# Patient Record
Sex: Female | Born: 1975 | Hispanic: Yes | Marital: Married | Smoking: Never smoker
Health system: Southern US, Community
[De-identification: ages and names within clinical notes are randomized; demographics above are authoritative.]

## PROBLEM LIST (undated history)

## (undated) DIAGNOSIS — B019 Varicella without complication: Secondary | ICD-10-CM

## (undated) DIAGNOSIS — F419 Anxiety disorder, unspecified: Secondary | ICD-10-CM

## (undated) DIAGNOSIS — F329 Major depressive disorder, single episode, unspecified: Secondary | ICD-10-CM

## (undated) DIAGNOSIS — Z789 Other specified health status: Secondary | ICD-10-CM

## (undated) DIAGNOSIS — F32A Depression, unspecified: Secondary | ICD-10-CM

## (undated) DIAGNOSIS — N809 Endometriosis, unspecified: Secondary | ICD-10-CM

## (undated) DIAGNOSIS — N979 Female infertility, unspecified: Secondary | ICD-10-CM

## (undated) DIAGNOSIS — T7431XA Adult psychological abuse, confirmed, initial encounter: Secondary | ICD-10-CM

## (undated) DIAGNOSIS — Z8719 Personal history of other diseases of the digestive system: Secondary | ICD-10-CM

## (undated) DIAGNOSIS — T7840XA Allergy, unspecified, initial encounter: Secondary | ICD-10-CM

## (undated) HISTORY — DX: Depression, unspecified: F32.A

## (undated) HISTORY — DX: Allergy, unspecified, initial encounter: T78.40XA

## (undated) HISTORY — DX: Personal history of other diseases of the digestive system: Z87.19

## (undated) HISTORY — PX: PELVIC LAPAROSCOPY: SHX162

## (undated) HISTORY — DX: Major depressive disorder, single episode, unspecified: F32.9

## (undated) HISTORY — DX: Female infertility, unspecified: N97.9

## (undated) HISTORY — PX: LAPAROSCOPIC ABDOMINAL EXPLORATION: SHX6249

## (undated) HISTORY — DX: Adult psychological abuse, confirmed, initial encounter: T74.31XA

## (undated) HISTORY — DX: Varicella without complication: B01.9

## (undated) HISTORY — DX: Anxiety disorder, unspecified: F41.9

---

## 2015-03-19 ENCOUNTER — Encounter (HOSPITAL_COMMUNITY): Payer: Self-pay | Admitting: *Deleted

## 2015-03-19 ENCOUNTER — Inpatient Hospital Stay (HOSPITAL_COMMUNITY)
Admission: AD | Admit: 2015-03-19 | Discharge: 2015-03-19 | Disposition: A | Discharge status: Home / Self Care | Payer: Self-pay | Source: Ambulatory Visit | Attending: Obstetrics & Gynecology | Admitting: Obstetrics & Gynecology

## 2015-03-19 ENCOUNTER — Inpatient Hospital Stay (HOSPITAL_COMMUNITY): Payer: Self-pay

## 2015-03-19 DIAGNOSIS — O360110 Maternal care for anti-D [Rh] antibodies, first trimester, not applicable or unspecified: Secondary | ICD-10-CM

## 2015-03-19 DIAGNOSIS — O209 Hemorrhage in early pregnancy, unspecified: Secondary | ICD-10-CM | POA: Insufficient documentation

## 2015-03-19 DIAGNOSIS — Z3A08 8 weeks gestation of pregnancy: Secondary | ICD-10-CM | POA: Insufficient documentation

## 2015-03-19 DIAGNOSIS — Z6791 Unspecified blood type, Rh negative: Secondary | ICD-10-CM | POA: Insufficient documentation

## 2015-03-19 DIAGNOSIS — O26841 Uterine size-date discrepancy, first trimester: Secondary | ICD-10-CM | POA: Insufficient documentation

## 2015-03-19 DIAGNOSIS — O26893 Other specified pregnancy related conditions, third trimester: Secondary | ICD-10-CM | POA: Insufficient documentation

## 2015-03-19 HISTORY — DX: Endometriosis, unspecified: N80.9

## 2015-03-19 LAB — URINALYSIS, ROUTINE W REFLEX MICROSCOPIC
BILIRUBIN URINE: NEGATIVE
GLUCOSE, UA: NEGATIVE mg/dL
Ketones, ur: NEGATIVE mg/dL
Leukocytes, UA: NEGATIVE
Nitrite: NEGATIVE
Protein, ur: NEGATIVE mg/dL
Specific Gravity, Urine: 1.01 (ref 1.005–1.030)
UROBILINOGEN UA: 0.2 mg/dL (ref 0.0–1.0)
pH: 6 (ref 5.0–8.0)

## 2015-03-19 LAB — URINE MICROSCOPIC-ADD ON

## 2015-03-19 LAB — CBC
HEMATOCRIT: 38.2 % (ref 36.0–46.0)
Hemoglobin: 12.9 g/dL (ref 12.0–15.0)
MCH: 29.4 pg (ref 26.0–34.0)
MCHC: 33.8 g/dL (ref 30.0–36.0)
MCV: 87 fL (ref 78.0–100.0)
Platelets: 286 10*3/uL (ref 150–400)
RBC: 4.39 MIL/uL (ref 3.87–5.11)
RDW: 12.6 % (ref 11.5–15.5)
WBC: 8.7 10*3/uL (ref 4.0–10.5)

## 2015-03-19 LAB — WET PREP, GENITAL
Clue Cells Wet Prep HPF POC: NONE SEEN
Trich, Wet Prep: NONE SEEN
Yeast Wet Prep HPF POC: NONE SEEN

## 2015-03-19 LAB — POCT PREGNANCY, URINE: Preg Test, Ur: POSITIVE — AB

## 2015-03-19 LAB — ABO/RH: ABO/RH(D): A NEG

## 2015-03-19 LAB — HCG, QUANTITATIVE, PREGNANCY: hCG, Beta Chain, Quant, S: 1221 m[IU]/mL — ABNORMAL HIGH (ref <5)

## 2015-03-19 MED ORDER — RHO D IMMUNE GLOBULIN 1500 UNIT/2ML IJ SOSY
300.0000 ug | PREFILLED_SYRINGE | Freq: Once | INTRAMUSCULAR | Status: AC
  Administered 2015-03-19: 300 ug via INTRAMUSCULAR
  Filled 2015-03-19: qty 2

## 2015-03-19 MED ORDER — CONCEPT OB 130-92.4-1 MG PO CAPS: 1.0000 | ORAL_CAPSULE | Freq: Every day | ORAL | Status: DC

## 2015-03-19 NOTE — Discharge Instructions (Signed)
Vaginal Bleeding During Pregnancy, First Trimester °A small amount of bleeding (spotting) from the vagina is relatively common in early pregnancy. It usually stops on its own. Various things may cause bleeding or spotting in early pregnancy. Some bleeding may be related to the pregnancy, and some may not. In most cases, the bleeding is normal and is not a problem. However, bleeding can also be a sign of something serious. Be sure to tell your health care provider about any vaginal bleeding right away. °Some possible causes of vaginal bleeding during the first trimester include: °· Infection or inflammation of the cervix. °· Growths (polyps) on the cervix. °· Miscarriage or threatened miscarriage. °· Pregnancy tissue has developed outside of the uterus and in a fallopian tube (tubal pregnancy). °· Tiny cysts have developed in the uterus instead of pregnancy tissue (molar pregnancy). °HOME CARE INSTRUCTIONS  °Watch your condition for any changes. The following actions may help to lessen any discomfort you are feeling: °· Follow your health care provider's instructions for limiting your activity. If your health care provider orders bed rest, you may need to stay in bed and only get up to use the bathroom. However, your health care provider may allow you to continue light activity. °· If needed, make plans for someone to help with your regular activities and responsibilities while you are on bed rest. °· Keep track of the number of pads you use each day, how often you change pads, and how soaked (saturated) they are. Write this down. °· Do not use tampons. Do not douche. °· Do not have sexual intercourse or orgasms until approved by your health care provider. °· If you pass any tissue from your vagina, save the tissue so you can show it to your health care provider. °· Only take over-the-counter or prescription medicines as directed by your health care provider. °· Do not take aspirin because it can make you  bleed. °· Keep all follow-up appointments as directed by your health care provider. °SEEK MEDICAL CARE IF: °· You have any vaginal bleeding during any part of your pregnancy. °· You have cramps or labor pains. °· You have a fever, not controlled by medicine. °SEEK IMMEDIATE MEDICAL CARE IF:  °· You have severe cramps in your back or belly (abdomen). °· You pass large clots or tissue from your vagina. °· Your bleeding increases. °· You feel light-headed or weak, or you have fainting episodes. °· You have chills. °· You are leaking fluid or have a gush of fluid from your vagina. °· You pass out while having a bowel movement. °MAKE SURE YOU: °· Understand these instructions. °· Will watch your condition. °· Will get help right away if you are not doing well or get worse. °Document Released: 09/12/2005 Document Revised: 12/08/2013 Document Reviewed: 08/10/2013 °ExitCare® Patient Information ©2015 [employer]. This information is not intended to replace advice given to you by your health care provider. Make sure you discuss any questions you have with your health care provider. ° °Rh Incompatibility °Rh incompatibility is a condition that occurs during pregnancy if a woman has Rh-negative blood and her baby has Rh-positive blood. "Rh-negative" and "Rh-positive" refer to whether or not the blood has an Rh factor. An Rh factor is a specific protein found on the surface of red blood cells. If a woman has Rh factor, she is Rh-positive. If she does not have an Rh factor, she is Rh-negative. Having or not having an Rh factor does not affect the mother's general   health. However, it can cause problems during pregnancy.  WHAT KIND OF PROBLEMS CAN Rh INCOMPATIBILITY CAUSE? During pregnancy, blood from the baby can cross into the mother's bloodstream, especially during delivery. If a mother is Rh-negative and the baby is Rh-positive, the mother's defense system will react to the baby's blood as if it was a foreign substance and  will create proteins (antibodies). This is called sensitization. Once the mother is sensitized, her Rh antibodies will cross the placenta to the baby and attack the baby's Rh-positive blood as if it is a harmful substance.  Rh incompatibility can also happen if the Rh-negative pregnant woman is exposed to the Rh factor during a blood transfusion with Rh-positive blood.  HOW DOES THIS CONDITION AFFECT MY BABY? The Rh antibodies that attack and destroy the baby's red blood cells can lead to hemolytic disease in the baby. Hemolytic disease is when the red blood cells break down. This can cause:   Yellowing of the skin and eyes (jaundice).  The body to not have enough healthy red blood cells (anemia).   Brain damage.   Heart failure.   Death.  These antibodies usually do not cause problems during a first pregnancy. This is because the blood from the baby often times crosses into the mother's bloodstream during delivery, and the baby is born before many of the antibodies can develop. However, the antibodies stay in your body once they have formed. Because of this, Rh incompatibility is more likely to cause problems in second or later pregnancies (if the baby is Rh-positive).  HOW IS THIS CONDITION DIAGNOSED? When a woman becomes pregnant, blood tests may be done to find out her blood type and Rh factor. If the woman is Rh-negative, she also may have another blood test called an antibody screen. The antibody screen shows whether she has Rh antibodies in her blood. If she does, it means she was exposed to Rh-positive blood before, and she is at risk for Rh incompatibility.  To find out whether the baby is developing hemolytic anemia and how serious it is, caregivers may use more advanced tests, such as ultrasonography (commonly known as ultrasound).  HOW IS Rh INCOMPATIBILITY TREATED?  Rh incompatibility is treated with a shot of medicine called Rho (D) immune globulin. This medicine keeps the  woman's body from making antibodies that can cause serious problems in the baby or future babies.  Two shots will be given, one at around your seventh month of pregnancy and the other within 72 hours of your baby being born. If you are Rh-negative, you will need this medicine every time you have a baby with Rh-positive blood. If you already have antibodies in your blood, Rho (D) immune globulin will not help. Your doctor will not give you this medicine, but will watch your pregnancy closely for problems instead.  This shot may also be given to an Rh-negative woman when the risk of blood transfer between the mom and baby is high. The risk is high with:   An amniocentesis.   A miscarriage or an abortion.   An ectopic pregnancy.   Any vaginal bleeding during pregnancy.  Document Released: 05/25/2002 Document Revised: 12/08/2013 Document Reviewed: 03/17/2013 [facility] Patient Information 2015 [place], [place]. This information is not intended to replace advice given to you by your health care provider. Make sure you discuss any questions you have with your health care provider.  Pelvic Rest Pelvic rest is sometimes recommended for women when:   The placenta is partially  or completely covering the opening of the cervix (placenta previa).  There is bleeding between the uterine wall and the amniotic sac in the first trimester (subchorionic hemorrhage).  The cervix begins to open without labor starting (incompetent cervix, cervical insufficiency).  The labor is too early (preterm labor). HOME CARE INSTRUCTIONS  Do not have sexual intercourse, stimulation, or an orgasm.  Do not use tampons, douche, or put anything in the vagina.  Do not lift anything over 10 pounds (4.5 kg).  Avoid strenuous activity or straining your pelvic muscles. SEEK MEDICAL CARE IF:  You have any vaginal bleeding during pregnancy. Treat this as a potential emergency.  You have cramping pain felt low in the  stomach (stronger than menstrual cramps).  You notice vaginal discharge (watery, mucus, or bloody).  You have a low, dull backache.  There are regular contractions or uterine tightening. SEEK IMMEDIATE MEDICAL CARE IF: You have vaginal bleeding and have placenta previa.  Document Released: 03/30/2011 Document Revised: 02/25/2012 Document Reviewed: 03/30/2011 [facility] Patient Information 2015 [place], [place]. This information is not intended to replace advice given to you by your health care provider. Make sure you discuss any questions you have with your health care provider.

## 2015-03-19 NOTE — MAU Note (Signed)
Pink vag d/c this am. About 1830 went to [facility] and saw bright blood on tissue with ? Piece of tissue. Mild  abd discomfort

## 2015-03-19 NOTE — MAU Note (Addendum)
Pt reports pinkish discharge this morning. Then pt noticed some blood and some small clots about 2 hours ago. Upon using MAU bathroom, pt reports "not much bleeding" [facility]

## 2015-03-19 NOTE — MAU Provider Note (Signed)
Chief Complaint: Vaginal Bleeding   First Provider Initiated Contact with Patient 03/19/15 2012     SUBJECTIVE HPI: April Andersen is a 39 y.o. G1P0 at [redacted]w[redacted]d by LMP who presents to [facility] reporting pink bleeding since this morning that increased later in the day and decreased by this evening. Passed small clots. Denies passage of tissue.  NOB visit scheduled 4/13 at [facility] No other testing this pregnancy. Last period was shorter than usual, but normal time. Regular monthly cycles.   Past Medical History  Diagnosis Date  . Endometriosis    OB History  Gravida Para Term Preterm AB SAB TAB Ectopic Multiple Living  1             # Outcome Date GA Lbr Len/2nd Weight Sex Delivery Anes PTL Lv  1 Current              Past Surgical History  Procedure Laterality Date  . Laparoscopic abdominal exploration     History   Social History  . Marital Status: Married    Spouse Name: N/A  . Number of Children: N/A  . Years of Education: N/A   Occupational History  . Not on file.   Social History Main Topics  . Smoking status: Never Smoker   . Smokeless tobacco: Not on file  . Alcohol Use: No  . Drug Use: No  . Sexual Activity: Yes   Other Topics Concern  . Not on file   Social History Narrative   No current facility-administered medications on file prior to encounter.   No current outpatient prescriptions on file prior to encounter.   No Known Allergies  Review of Systems  Constitutional: Negative for fever and chills.  Gastrointestinal: Negative for nausea, vomiting, abdominal pain, diarrhea and constipation.  Genitourinary: Negative for dysuria, urgency, frequency, hematuria and flank pain.  Neurological: Negative for dizziness.   OBJECTIVE Blood pressure 122/66, pulse 87, temperature 98.7 F (37.1 C), resp. rate 20, height  (1.727 m), weight 153 lb (69.4 kg), last menstrual period 01/21/2015. GENERAL: Well-developed, well-nourished female in  no acute distress.  HEART: normal rate RESP: normal effort GI: Abdomen soft, non-tender. Positive bowel sounds 4. MS: Nontender, no edema NEURO: Alert and oriented GU:   SPECULUM EXAM: NEFG, small amount of dark red blood in vault. Scant active bleeding, cervix clean  BIMANUAL: cervix closed; uterus ? slightly enlarged, no adnexal tenderness or masses  No CVA tenderness.  LAB RESULTS Results for orders placed or performed during the hospital encounter of 03/19/15 (from the past 24 hour(s))  Urinalysis, Routine w reflex microscopic     Status: Abnormal   Collection Time: 03/19/15  7:06 PM  Result Value Ref Range   Color, Urine YELLOW YELLOW   APPearance CLEAR CLEAR   Specific Gravity, Urine 1.010 1.005 - 1.030   pH 6.0 5.0 - 8.0   Glucose, UA NEGATIVE NEGATIVE mg/dL   Hgb urine dipstick MODERATE (A) NEGATIVE   Bilirubin Urine NEGATIVE NEGATIVE   Ketones, ur NEGATIVE NEGATIVE mg/dL   Protein, ur NEGATIVE NEGATIVE mg/dL   Urobilinogen, UA 0.2 0.0 - 1.0 mg/dL   Nitrite NEGATIVE NEGATIVE   Leukocytes, UA NEGATIVE NEGATIVE  Urine microscopic-add on     Status: Abnormal   Collection Time: 03/19/15  7:06 PM  Result Value Ref Range   Squamous Epithelial / LPF RARE RARE   WBC, UA 0-2 <3 WBC/hpf   RBC / HPF 7-10 <3 RBC/hpf   Bacteria, UA FEW (A)  RARE  Pregnancy, urine POC     Status: Abnormal   Collection Time: 03/19/15  7:23 PM  Result Value Ref Range   Preg Test, Ur POSITIVE (A) NEGATIVE  ABO/Rh     Status: None   Collection Time: 03/19/15  8:15 PM  Result Value Ref Range   ABO/RH(D) A NEG   hCG, quantitative, pregnancy     Status: Abnormal   Collection Time: 03/19/15  8:15 PM  Result Value Ref Range   hCG, Beta Chain, Quant, S 1221 (H) <5 mIU/mL  CBC     Status: None   Collection Time: 03/19/15  8:15 PM  Result Value Ref Range   WBC 8.7 4.0 - 10.5 K/uL   RBC 4.39 3.87 - 5.11 MIL/uL   Hemoglobin 12.9 12.0 - 15.0 g/dL   HCT 40.938.2 81.136.0 - 91.446.0 %   MCV 87.0 78.0 - 100.0 fL    MCH 29.4 26.0 - 34.0 pg   MCHC 33.8 30.0 - 36.0 g/dL   RDW 78.212.6 95.611.5 - 21.315.5 %   Platelets 286 150 - 400 K/uL  Rh IG workup (includes ABO/Rh)     Status: None (Preliminary result)   Collection Time: 03/19/15  8:15 PM  Result Value Ref Range   Gestational Age(Wks) 4.6    ABO/RH(D) A NEG    Antibody Screen NEG    Unit Number [id number]    Blood Component Type RHIG    Unit division 00    Status of Unit ISSUED    Transfusion Status OK TO TRANSFUSE   Wet prep, genital     Status: Abnormal   Collection Time: 03/19/15  9:40 PM  Result Value Ref Range   Yeast Wet Prep HPF POC NONE SEEN NONE SEEN   Trich, Wet Prep NONE SEEN NONE SEEN   Clue Cells Wet Prep HPF POC NONE SEEN NONE SEEN   WBC, Wet Prep HPF POC FEW (A) NONE SEEN    IMAGING [place] Ob Comp Less 14 Wks  03/19/2015   CLINICAL DATA:  Vaginal bleeding, positive pregnancy test  EXAM: OBSTETRIC <14 WK US AND TRANSVAGINAL OB US  TECHNIQUE: Both transabdominal and transvaginal ultrasound examinations were performed for complete evaluation of the gestation as well as the maternal uterus, adnexal regions, and pelvic cul-de-sac. Transvaginal technique was performed to assess early pregnancy.  COMPARISON:  None for this pregnancy  FINDINGS: Intrauterine gestational sac: Visualized, mildly irregular in shape and mobile within the endometrial canal at real time imaging according to the sonographer. Small to moderate probable subchorionic hemorrhage.  Yolk sac:  Not visualized  Embryo:  Not visualized  Cardiac Activity: Not visualized  MSD: 4  mm   4 w   6  d               US EDC: 11/20/2015  Maternal uterus/adnexae: Right ovarian corpus luteum. Left ovary is normal. Trace free fluid in the cul-de-sac.  IMPRESSION: Mildly irregular, mobile intrauterine gestational sac but no yolk sac, fetal pole, or cardiac activity yet visualized. Findings are suspicious for, but not definitive of, failed intrauterine pregnancy. Followup ultrasound is recommended in 10-14  days for further assessment and to establish accurate dating.   Electronically Signed   By: Christiana PellantGretchen  Green M.D.   On: 03/19/2015 21:34   [place] Ob Transvaginal  03/19/2015   CLINICAL DATA:  Vaginal bleeding, positive pregnancy test  EXAM: OBSTETRIC <14 WK US AND TRANSVAGINAL OB US  TECHNIQUE: Both transabdominal and transvaginal ultrasound examinations were performed for  complete evaluation of the gestation as well as the maternal uterus, adnexal regions, and pelvic cul-de-sac. Transvaginal technique was performed to assess early pregnancy.  COMPARISON:  None for this pregnancy  FINDINGS: Intrauterine gestational sac: Visualized, mildly irregular in shape and mobile within the endometrial canal at real time imaging according to the sonographer. Small to moderate probable subchorionic hemorrhage.  Yolk sac:  Not visualized  Embryo:  Not visualized  Cardiac Activity: Not visualized  MSD: 4  mm   4 w   6  d               [place] EDC: 11/20/2015  Maternal uterus/adnexae: Right ovarian corpus luteum. Left ovary is normal. Trace free fluid in the cul-de-sac.  IMPRESSION: Mildly irregular, mobile intrauterine gestational sac but no yolk sac, fetal pole, or cardiac activity yet visualized. Findings are suspicious for, but not definitive of, failed intrauterine pregnancy. Followup ultrasound is recommended in 10-14 days for further assessment and to establish accurate dating.   Electronically Signed   By: Christiana Pellant M.D.   On: 03/19/2015 21:34    MAU COURSE Rhophylac given.   ASSESSMENT 1. Rh negative, antepartum, first trimester, not applicable or unspecified fetus   2. Vaginal bleeding in pregnancy, first trimester   3. Uterine size date discrepancy pregnancy, first trimester    PLAN Discharge home in stable condition. SAB and ectopic precautions. Bleeding precautions. Pelvic rest 1 week. GC/Chlamydia cultures pending     Follow-up Information    Follow up with THE [facility]  MATERNITY ADMISSIONS In 2 days.   Why:  for repeat pregnancy hormone level or sooner as needed if symptoms worsen.    Contact information:   [address] [id number] mc [place] [address] [phone]       Medication List    TAKE these medications        CONCEPT OB 130-92.4-1 MG Caps  Take 1 tablet by mouth daily.       [place], CNM 03/19/2015  11:40 PM

## 2015-03-20 LAB — RH IG WORKUP (INCLUDES ABO/RH)
ABO/RH(D): A NEG
Antibody Screen: NEGATIVE
Gestational Age(Wks): 4.6
Unit division: 0

## 2015-03-21 ENCOUNTER — Inpatient Hospital Stay (HOSPITAL_COMMUNITY)
Admission: AD | Admit: 2015-03-21 | Discharge: 2015-03-21 | Disposition: A | Discharge status: Home / Self Care | Payer: Self-pay | Source: Ambulatory Visit | Attending: Obstetrics & Gynecology | Admitting: Obstetrics & Gynecology

## 2015-03-21 DIAGNOSIS — O3680X0 Pregnancy with inconclusive fetal viability, not applicable or unspecified: Secondary | ICD-10-CM

## 2015-03-21 DIAGNOSIS — Z3A08 8 weeks gestation of pregnancy: Secondary | ICD-10-CM | POA: Insufficient documentation

## 2015-03-21 DIAGNOSIS — O0281 Inappropriate change in quantitative human chorionic gonadotropin (hCG) in early pregnancy: Secondary | ICD-10-CM | POA: Insufficient documentation

## 2015-03-21 DIAGNOSIS — R109 Unspecified abdominal pain: Secondary | ICD-10-CM | POA: Insufficient documentation

## 2015-03-21 DIAGNOSIS — N898 Other specified noninflammatory disorders of vagina: Secondary | ICD-10-CM | POA: Insufficient documentation

## 2015-03-21 DIAGNOSIS — O9989 Other specified diseases and conditions complicating pregnancy, childbirth and the puerperium: Secondary | ICD-10-CM | POA: Insufficient documentation

## 2015-03-21 LAB — GC/CHLAMYDIA PROBE AMP (~~LOC~~) NOT AT ARMC
CHLAMYDIA, DNA PROBE: NEGATIVE
NEISSERIA GONORRHEA: NEGATIVE

## 2015-03-21 LAB — HCG, QUANTITATIVE, PREGNANCY: hCG, Beta Chain, Quant, S: 1541 m[IU]/mL — ABNORMAL HIGH (ref <5)

## 2015-03-21 NOTE — Discharge Instructions (Signed)
°Ectopic Pregnancy °An ectopic pregnancy is when the fertilized egg attaches (implants) outside the uterus. Most ectopic pregnancies occur in the fallopian tube. Rarely do ectopic pregnancies occur on the ovary, intestine, pelvis, or cervix. In an ectopic pregnancy, the fertilized egg does not have the ability to develop into a normal, healthy baby.  °A ruptured ectopic pregnancy is one in which the fallopian tube gets torn or bursts and results in internal bleeding. Often there is intense abdominal pain, and sometimes, vaginal bleeding. Having an ectopic pregnancy can be life threatening. If left untreated, this dangerous condition can lead to a blood transfusion, abdominal surgery, or even death. °CAUSES  °Damage to the fallopian tubes is the suspected cause in most ectopic pregnancies.  °RISK FACTORS °Depending on your circumstances, the risk of having an ectopic pregnancy will vary. The level of risk can be divided into three categories. °High Risk °· You have gone through infertility treatment. °· You have had a previous ectopic pregnancy. °· You have had previous tubal surgery. °· You have had previous surgery to have the fallopian tubes tied (tubal ligation). °· You have tubal problems or diseases. °· You have been exposed to DES. DES is a medicine that was used until 1971 and had effects on babies whose mothers took the medicine. °· You become pregnant while using an intrauterine device (IUD) for birth control.  °Moderate Risk °· You have a history of infertility. °· You have a history of a sexually transmitted infection (STI). °· You have a history of pelvic inflammatory disease (PID). °· You have scarring from endometriosis. °· You have multiple sexual partners. °· You smoke.  °Low Risk °· You have had previous pelvic surgery. °· You use vaginal douching. °· You became sexually active before 37 years of age. °SIGNS AND SYMPTOMS  °An ectopic pregnancy should be suspected in anyone who has missed a period  and has abdominal pain or bleeding. °· You may experience normal pregnancy symptoms, such as: °¨ Nausea. °¨ Tiredness. °¨ Breast tenderness. °· Other symptoms may include: °¨ Pain with intercourse. °¨ Irregular vaginal bleeding or spotting. °¨ Cramping or pain on one side or in the lower abdomen. °¨ Fast heartbeat. °¨ Passing out while having a bowel movement. °· Symptoms of a ruptured ectopic pregnancy and internal bleeding may include: °¨ Sudden, severe pain in the abdomen and pelvis. °¨ Dizziness or fainting. °¨ Pain in the shoulder area. °DIAGNOSIS  °Tests that may be performed include: °· A pregnancy test. °· An ultrasound test. °· Testing the specific level of pregnancy hormone in the bloodstream. °· Taking a sample of uterus tissue (dilation and curettage, D&C). °· Surgery to perform a visual exam of the inside of the abdomen using a thin, lighted tube with a tiny camera on the end (laparoscope). °TREATMENT  °An injection of a medicine called methotrexate may be given. This medicine causes the pregnancy tissue to be absorbed. It is given if: °· The diagnosis is made early. °· The fallopian tube has not ruptured. °· You are considered to be a good candidate for the medicine. °Usually, pregnancy hormone blood levels are checked after methotrexate treatment. This is to be sure the medicine is effective. It may take 4-6 weeks for the pregnancy to be absorbed (though most pregnancies will be absorbed by 3 weeks). °Surgical treatment may be needed. A laparoscope may be used to remove the pregnancy tissue. If severe internal bleeding occurs, a cut (incision) may be made in the lower abdomen (laparotomy), and the ectopic   pregnancy is removed. This stops the bleeding. Part of the fallopian tube, or the whole tube, may be removed as well (salpingectomy). After surgery, pregnancy hormone tests may be done to be sure there is no pregnancy tissue left. You may receive a Rho (D) immune globulin shot if you are Rh negative  and the father is Rh positive, or if you do not know the Rh type of the father. This is to prevent problems with any future pregnancy. °SEEK IMMEDIATE MEDICAL CARE IF:  °You have any symptoms of an ectopic pregnancy. This is a medical emergency. °MAKE SURE YOU: °· Understand these instructions. °· Will watch your condition. °· Will get help right away if you are not doing well or get worse. °Document Released: 01/10/2005 Document Revised: 04/19/2014 Document Reviewed: 07/02/2013 °ExitCare® Patient Information ©2015 [employer]. This information is not intended to replace advice given to you by your health care provider. Make sure you discuss any questions you have with your health care provider. ° ° °

## 2015-03-21 NOTE — MAU Provider Note (Signed)
  History     CSN: [id number]  Arrival date and time: 03/21/15 1907   None     No chief complaint on file.  HPI Comments: April Andersen is a 39 y.o. G1P0 at 195w3d who presents today for FU BHCG. She denies any pain today, and reports only brown spotting when she wipes.   Abdominal Pain The current episode started in the past 7 days. The problem has been resolved. The pain is at a severity of 0/10. The patient is experiencing no pain. Pertinent negatives include no dysuria, frequency, nausea or vomiting.  Vaginal Bleeding The patient's primary symptoms include vaginal bleeding. The current episode started in the past 7 days. The problem has been gradually improving. The patient is experiencing no pain. She is pregnant. Pertinent negatives include no abdominal pain, dysuria, frequency, nausea, urgency or vomiting. The vaginal discharge was brown. The vaginal bleeding is spotting. She has not been passing clots. She has not been passing tissue.      Past Medical History  Diagnosis Date  . Endometriosis     Past Surgical History  Procedure Laterality Date  . Laparoscopic abdominal exploration      No family history on file.  History  Substance Use Topics  . Smoking status: Never Smoker   . Smokeless tobacco: Not on file  . Alcohol Use: No    Allergies: No Known Allergies  Prescriptions prior to admission  Medication Sig Dispense Refill Last Dose  . Prenat w/o A Vit-FeFum-FePo-FA (CONCEPT OB) 130-92.4-1 MG CAPS Take 1 tablet by mouth daily. 30 capsule 12     Review of Systems  Gastrointestinal: Negative for nausea, vomiting and abdominal pain.  Genitourinary: Positive for vaginal bleeding. Negative for dysuria, urgency and frequency.   Physical Exam   Blood pressure 100/71, pulse 78, temperature 98.4 F (36.9 C), resp. rate 16, last menstrual period 01/21/2015, SpO2 100 %.  Physical Exam  Nursing note and vitals reviewed. Constitutional: She is oriented to  person, place, and time. She appears well-developed and well-nourished. No distress.  Cardiovascular: Normal rate.   Respiratory: Effort normal.  GI: Soft.  Neurological: She is alert and oriented to person, place, and time.  Skin: Skin is warm and dry.  Psychiatric: She has a normal mood and affect.   Results for April GerlachJASSO Andersen, April Andersen (MRN [id number]) as of 03/21/2015 20:38  Ref. Range 03/19/2015 20:15 03/19/2015 20:15 03/19/2015 21:12 03/19/2015 21:40 03/21/2015 19:35  hCG, Beta Chain, Quant, S Latest Range: <5 mIU/mL 1221 (H)    1541 (H)   MAU Course  Procedures  MDM   Assessment and Plan   1. Inappropriate change in quantitative hCG in early pregnancy   2. Pregnancy of unknown anatomic location    Ectopic precautions reviewed Bleeding precautions reviewed Return to MAU in 48 hours or sooner if needed  Follow-up Information    [facility]   Why:  Wednesday 03/23/15 for repeat blood work    Contact information:   [address] [id number] [place] [phone]       Tawnya CrookHogan, Heather Donovan 03/21/2015, 8:39 PM

## 2015-03-21 NOTE — MAU Note (Signed)
Pt presents for repeat BHCG, denies pain. Reports brownish discharge on tissue when she wipes.

## 2015-03-22 LAB — HIV ANTIBODY (ROUTINE TESTING W REFLEX): HIV Screen 4th Generation wRfx: NONREACTIVE

## 2015-03-23 ENCOUNTER — Inpatient Hospital Stay (HOSPITAL_COMMUNITY)
Admission: AD | Admit: 2015-03-23 | Discharge: 2015-03-23 | Disposition: A | Discharge status: Home / Self Care | Payer: Self-pay | Source: Ambulatory Visit | Attending: Obstetrics and Gynecology | Admitting: Obstetrics and Gynecology

## 2015-03-23 DIAGNOSIS — Z3A08 8 weeks gestation of pregnancy: Secondary | ICD-10-CM | POA: Insufficient documentation

## 2015-03-23 DIAGNOSIS — R799 Abnormal finding of blood chemistry, unspecified: Secondary | ICD-10-CM

## 2015-03-23 DIAGNOSIS — O0281 Inappropriate change in quantitative human chorionic gonadotropin (hCG) in early pregnancy: Secondary | ICD-10-CM | POA: Insufficient documentation

## 2015-03-23 DIAGNOSIS — IMO0002 Reserved for concepts with insufficient information to code with codable children: Secondary | ICD-10-CM

## 2015-03-23 LAB — HCG, QUANTITATIVE, PREGNANCY: hCG, Beta Chain, Quant, S: 1320 m[IU]/mL — ABNORMAL HIGH (ref <5)

## 2015-03-23 NOTE — MAU Provider Note (Signed)
  History     CSN: [id number]  Arrival date and time: 03/23/15 1912   None     Chief Complaint  Patient presents with  . Labs Only   HPI  April DunningMariana Andersen April Andersen is a 39 y.o. G1P0 at 3737w5d who presents today for FU HCG. She denies any pain today. She is having some spotting.   Past Medical History  Diagnosis Date  . Endometriosis     Past Surgical History  Procedure Laterality Date  . Laparoscopic abdominal exploration      No family history on file.  History  Substance Use Topics  . Smoking status: Never Smoker   . Smokeless tobacco: Not on file  . Alcohol Use: No    Allergies: No Known Allergies  Prescriptions prior to admission  Medication Sig Dispense Refill Last Dose  . Prenat w/o A Vit-FeFum-FePo-FA (CONCEPT OB) 130-92.4-1 MG CAPS Take 1 tablet by mouth daily. 30 capsule 12     ROS Physical Exam   Blood pressure 117/70, pulse 89, temperature 98.8 F (37.1 C), temperature source Oral, resp. rate 18, last menstrual period 01/21/2015, SpO2 100 %.  Physical Exam  Nursing note and vitals reviewed. Constitutional: She is oriented to person, place, and time. She appears well-developed and well-nourished. No distress.  Cardiovascular: Normal rate.   Respiratory: Effort normal.  GI: Soft. There is no tenderness.  Neurological: She is oriented to person, place, and time.  Skin: Skin is warm and dry.  Psychiatric: She has a normal mood and affect.   Results for Soledad GerlachJASSO PEREZ, Lakitha (MRN [id number]) as of 03/23/2015 21:12  Ref. Range 03/19/2015 20:15 03/19/2015 20:15 03/19/2015 21:12 03/19/2015 21:40 03/21/2015 19:35 03/23/2015 19:53  hCG, Beta Chain, Quant, S Latest Range: <5 mIU/mL 1221 (H)    1541 (H) 1320 (H)   MAU Course  Procedures  MDM  Assessment and Plan   1. Abnormal human chorionic gonadotropin (hCG)    DC home Precautions reviewed SAB precautions and bleeding patterns discussed with the patient, and questions answered  Return to MAU as needed if sx  worsen FU in 1 week for HCG  Follow-up Information    [facility]   Specialty:  Obstetrics and Gynecology   Why:  TUESDAY 03/29/15 BETWEEN 1-3 PM    Contact information:   [address] [place] [id number] [phone]       Tawnya CrookHogan, Heather Donovan 03/23/2015, 9:14 PM

## 2015-03-23 NOTE — MAU Note (Signed)
Here for follow up labs. States still having small amount of bleeding. Denies pain.

## 2015-03-23 NOTE — Discharge Instructions (Signed)
Miscarriage A miscarriage is the sudden loss of an unborn baby (fetus) before the 20th week of pregnancy. Most miscarriages happen in the first 3 months of pregnancy. Sometimes, it happens before a woman even knows she is pregnant. A miscarriage is also called a "spontaneous miscarriage" or "early pregnancy loss." Having a miscarriage can be an emotional experience. Talk with your caregiver about any questions you may have about miscarrying, the grieving process, and your future pregnancy plans. CAUSES   Problems with the fetal chromosomes that make it impossible for the baby to develop normally. Problems with the baby's genes or chromosomes are most often the result of errors that occur, by chance, as the embryo divides and grows. The problems are not inherited from the parents.  Infection of the cervix or uterus.   Hormone problems.   Problems with the cervix, such as having an incompetent cervix. This is when the tissue in the cervix is not strong enough to hold the pregnancy.   Problems with the uterus, such as an abnormally shaped uterus, uterine fibroids, or congenital abnormalities.   Certain medical conditions.   Smoking, drinking alcohol, or taking illegal drugs.   Trauma.  Often, the cause of a miscarriage is unknown.  SYMPTOMS   Vaginal bleeding or spotting, with or without cramps or pain.  Pain or cramping in the abdomen or lower back.  Passing fluid, tissue, or blood clots from the vagina. DIAGNOSIS  Your caregiver will perform a physical exam. You may also have an ultrasound to confirm the miscarriage. Blood or urine tests may also be ordered. TREATMENT   Sometimes, treatment is not necessary if you naturally pass all the fetal tissue that was in the uterus. If some of the fetus or placenta remains in the body (incomplete miscarriage), tissue left behind may become infected and must be removed. Usually, a dilation and curettage (D and C) procedure is performed.  During a D and C procedure, the cervix is widened (dilated) and any remaining fetal or placental tissue is gently removed from the uterus.  Antibiotic medicines are prescribed if there is an infection. Other medicines may be given to reduce the size of the uterus (contract) if there is a lot of bleeding.  If you have Rh negative blood and your baby was Rh positive, you will need a Rh immunoglobulin shot. This shot will protect any future baby from having Rh blood problems in future pregnancies. HOME CARE INSTRUCTIONS   Your caregiver may order bed rest or may allow you to continue light activity. Resume activity as directed by your caregiver.  Have someone help with home and family responsibilities during this time.   Keep track of the number of sanitary pads you use each day and how soaked (saturated) they are. Write down this information.   Do not use tampons. Do not douche or have sexual intercourse until approved by your caregiver.   Only take over-the-counter or prescription medicines for pain or discomfort as directed by your caregiver.   Do not take aspirin. Aspirin can cause bleeding.   Keep all follow-up appointments with your caregiver.   If you or your partner have problems with grieving, talk to your caregiver or seek counseling to help cope with the pregnancy loss. Allow enough time to grieve before trying to get pregnant again.  SEEK IMMEDIATE MEDICAL CARE IF:   You have severe cramps or pain in your back or abdomen.  You have a fever.  You pass large blood clots (walnut-sized   or larger) ortissue from your vagina. Save any tissue for your caregiver to inspect.   Your bleeding increases.   You have a thick, bad-smelling vaginal discharge.  You become lightheaded, weak, or you faint.   You have chills.  MAKE SURE YOU:  Understand these instructions.  Will watch your condition.  Will get help right away if you are not doing well or get  worse. Document Released: 05/29/2001 Document Revised: 03/30/2013 Document Reviewed: 01/22/2012 ExitCare Patient Information 2015 [employer]. This information is not intended to replace advice given to you by your health care provider. Make sure you discuss any questions you have with your health care provider.  

## 2015-03-24 ENCOUNTER — Inpatient Hospital Stay (HOSPITAL_COMMUNITY)
Admission: AD | Admit: 2015-03-24 | Discharge: 2015-03-24 | Disposition: A | Discharge status: Home / Self Care | Payer: Self-pay | Source: Ambulatory Visit | Attending: Obstetrics & Gynecology | Admitting: Obstetrics & Gynecology

## 2015-03-24 ENCOUNTER — Encounter (HOSPITAL_COMMUNITY): Payer: Self-pay | Admitting: *Deleted

## 2015-03-24 DIAGNOSIS — O039 Complete or unspecified spontaneous abortion without complication: Secondary | ICD-10-CM | POA: Insufficient documentation

## 2015-03-24 DIAGNOSIS — R109 Unspecified abdominal pain: Secondary | ICD-10-CM | POA: Insufficient documentation

## 2015-03-24 HISTORY — DX: Other specified health status: Z78.9

## 2015-03-24 LAB — HCG, QUANTITATIVE, PREGNANCY: HCG, BETA CHAIN, QUANT, S: 723 m[IU]/mL — AB (ref <5)

## 2015-03-24 MED ORDER — LACTATED RINGERS IV BOLUS (SEPSIS)
1000.0000 mL | Freq: Once | INTRAVENOUS | Status: AC
  Administered 2015-03-24: 1000 mL via INTRAVENOUS

## 2015-03-24 MED ORDER — HYDROMORPHONE HCL 1 MG/ML IJ SOLN
1.0000 mg | Freq: Once | INTRAMUSCULAR | Status: AC
  Administered 2015-03-24: 1 mg via INTRAVENOUS
  Filled 2015-03-24: qty 1

## 2015-03-24 MED ORDER — METOCLOPRAMIDE HCL 5 MG/ML IJ SOLN
10.0000 mg | Freq: Once | INTRAMUSCULAR | Status: AC
  Administered 2015-03-24: 10 mg via INTRAVENOUS
  Filled 2015-03-24: qty 2

## 2015-03-24 NOTE — MAU Note (Signed)
Pt states she is having a miscarriage, her pain started last night & is worse this morning.  States she is bleeding "a little bit."

## 2015-03-24 NOTE — MAU Provider Note (Signed)
CSN: [id number]     Arrival date & time 03/24/15  [id number] History   None    Chief Complaint  Patient presents with  . Abdominal Pain  . Vaginal Bleeding     (Consider location/radiation/quality/duration/timing/severity/associated sxs/prior Treatment) Patient is a 39 y.o. female presenting with abdominal pain and vaginal bleeding. The history is provided by the patient.  Abdominal Pain The primary symptoms of the illness include abdominal pain and vaginal bleeding. The onset of the illness was gradual.  The patient states that she believes she is currently pregnant. The patient has not had a change in bowel habit. Additional symptoms associated with the illness include back pain.  Vaginal Bleeding Associated symptoms include abdominal pain.   April DunningMariana Andersen April Andersen is a 39 y.o.  G1P0 @ 4415w6d gestation by LMP who presents to the MAU with abdominal pain that radiates to her lower back. The pain started about 11 pm last night. She has been followed for pain in early pregnancy and has had falling Bhcg and ultrasound that showed most likely failed pregnancy. The patient states that this morning the bleeding is light but the pain became severe so she returned. When she was here last night her Bhcg had dropped from 1541 to 1320.   Past Medical History  Diagnosis Date  . Endometriosis   . Medical history non-contributory    Past Surgical History  Procedure Laterality Date  . Laparoscopic abdominal exploration     History reviewed. No pertinent family history. History  Substance Use Topics  . Smoking status: Never Smoker   . Smokeless tobacco: Not on file  . Alcohol Use: No   OB History    Gravida Para Term Preterm AB TAB SAB Ectopic Multiple Living   1              Review of Systems  Gastrointestinal: Positive for abdominal pain.  Genitourinary: Positive for vaginal bleeding.  Musculoskeletal: Positive for back pain.  all other systems negative    Allergies  Review of patient's  allergies indicates no known allergies.  Home Medications   Prior to Admission medications   Medication Sig Start Date End Date Taking? Authorizing Provider  Prenat w/o A Vit-FeFum-FePo-FA (CONCEPT OB) 130-92.4-1 MG CAPS Take 1 tablet by mouth daily. 03/19/15  Yes Virginia Smith, CNM   BP 93/58 mmHg  Pulse 65  Temp(Src) 98.3 F (36.8 C) (Oral)  Resp 18  LMP 01/21/2015 (LMP Unknown) Physical Exam  Constitutional: She is oriented to person, place, and time. She appears well-developed and well-nourished. No distress.  Appears uncomfortable  HENT:  Head: Normocephalic.  Eyes: Conjunctivae and EOM are normal.  Neck: Neck supple.  Cardiovascular: Normal rate.   Pulmonary/Chest: Effort normal.  Abdominal: Soft. There is tenderness in the right lower quadrant, suprapubic area and left lower quadrant.  Genitourinary:  External genitalia without lesions, moderate blood vault. Cervix open with tissue. Using ring forceps tissue removed and sent to pathology.   Musculoskeletal: Normal range of motion.  Neurological: She is alert and oriented to person, place, and time. No cranial nerve deficit.  Skin: Skin is warm and dry.  Psychiatric: She has a normal mood and affect. Her behavior is normal.  Nursing note and vitals reviewed.   ED Course  Procedures  Labs, IV LR, Dilaudid 1 mg, Reglan 10 mg IV,  Re assessed and patient is pain free and bleeding is small, will observe for bleeding and pain.  Patient with minimal bleeding and no pain prior to d/c  Results for orders placed or performed during the hospital encounter of 03/24/15 (from the past 24 hour(s))  hCG, quantitative, pregnancy     Status: Abnormal   Collection Time: 03/24/15 11:55 AM  Result Value Ref Range   hCG, Beta Chain, Quant, S 723 (H) <5 mIU/mL    MDM  39 y.o. female with vaginal bleeding and cramping. Stable for d/c to follow up in the GYN clinic in 2 weeks. She will return here sooner for any problems.  Final  diagnoses:  SAB (spontaneous abortion)

## 2015-03-24 NOTE — Discharge Instructions (Signed)
Someone from the GYN OFfice will call you to schedule a follow up appointment. Return here as needed for problems.   Miscarriage A miscarriage is the loss of an unborn baby (fetus) before the 20th week of pregnancy. The cause is often unknown.  HOME CARE  You may need to stay in bed (bed rest), or you may be able to do light activity. Go about activity as told by your doctor.  Have help at home.  Write down how many pads you use each day. Write down how soaked they are.  Do not use tampons. Do not wash out your vagina (douche) or have sex (intercourse) until your doctor approves.  Only take medicine as told by your doctor.  Do not take aspirin.  Keep all doctor visits as told.  If you or your partner have problems with grieving, talk to your doctor. You can also try counseling. Give yourself time to grieve before trying to get pregnant again. GET HELP RIGHT AWAY IF:  You have bad cramps or pain in your back or belly (abdomen).  You have a fever.  You pass large clumps of blood (clots) from your vagina that are walnut-sized or larger. Save the clumps for your doctor to see.  You pass large amounts of tissue from your vagina. Save the tissue for your doctor to see.  You have more bleeding.  You have thick, bad-smelling fluid (discharge) coming from the vagina.  You get lightheaded, weak, or you pass out (faint).  You have chills. MAKE SURE YOU:  Understand these instructions.  Will watch your condition.  Will get help right away if you are not doing well or get worse. Document Released: 02/25/2012 Document Reviewed: 02/25/2012 [facility] Patient Information 2015 [place]. This information is not intended to replace advice given to you by your health care provider. Make sure you discuss any questions you have with your health care provider.

## 2015-03-29 ENCOUNTER — Other Ambulatory Visit: Payer: Self-pay

## 2015-03-29 NOTE — Progress Notes (Signed)
Spoke to Dr Macon LargeAnyanwu who states bhcg not needed today since patient is coming in on 4/20 to see provider.

## 2015-03-30 ENCOUNTER — Encounter: Payer: Self-pay | Admitting: Advanced Practice Midwife

## 2015-04-06 ENCOUNTER — Ambulatory Visit (INDEPENDENT_AMBULATORY_CARE_PROVIDER_SITE_OTHER): Payer: Self-pay | Admitting: Obstetrics & Gynecology

## 2015-04-06 ENCOUNTER — Encounter: Payer: Self-pay | Admitting: Obstetrics & Gynecology

## 2015-04-06 VITALS — BP 117/78 | HR 86 | Temp 98.6°F | Ht 68.0 in | Wt 154.7 lb

## 2015-04-06 DIAGNOSIS — O039 Complete or unspecified spontaneous abortion without complication: Secondary | ICD-10-CM

## 2015-04-06 LAB — CBC
HEMATOCRIT: 38.6 % (ref 36.0–46.0)
HEMOGLOBIN: 13.2 g/dL (ref 12.0–15.0)
MCH: 29.5 pg (ref 26.0–34.0)
MCHC: 34.2 g/dL (ref 30.0–36.0)
MCV: 86.4 fL (ref 78.0–100.0)
MPV: 10.9 fL (ref 8.6–12.4)
Platelets: 293 10*3/uL (ref 150–400)
RBC: 4.47 MIL/uL (ref 3.87–5.11)
RDW: 13.5 % (ref 11.5–15.5)
WBC: 6 10*3/uL (ref 4.0–10.5)

## 2015-04-06 NOTE — Progress Notes (Signed)
Reports dizziness and nausea since last Monday 4/11. Stopped bleeding 4/10. Reports night sweats since miscarriage. Alis used for interpreter

## 2015-04-06 NOTE — Patient Instructions (Signed)
Aborto espontáneo  °(Miscarriage) °El aborto espontáneo es la pérdida de un bebé que no ha nacido (feto) antes de la semana 20 del embarazo. La mayor parte de estos abortos ocurre en los primeros 3 meses. En algunos casos ocurre antes de que la mujer sepa que está embarazada. También se denomina "aborto espontáneo" o "pérdida prematura del embarazo". El aborto espontáneo puede ser una experiencia que afecte emocionalmente a la persona. Converse con su médico si tiene dudas, cómo es el proceso de duelo, y sobre planes futuros de embarazo.  °CAUSAS  °· Algunos problemas cromosómicos pueden hacer imposible que el bebé se desarrolle normalmente. Los problemas con los genes o cromosomas del bebé son generalmente el resultado de errores que se producen, por casualidad, cuando el embrión se divide y crece. Estos problemas no se heredan de los padres. °· Infección en el cuello del útero.   °· Problemas hormonales.   °· Problemas en el cuello del útero, como tener un útero incompetente. Esto ocurre cuando los tejidos no son lo suficientemente fuertes como para contener el embarazo.   °· Problemas del útero, como un útero con forma anormal, los fibromas o anormalidades congénitas.   °· Ciertas enfermedades crónicas.   °· No fume, no beba alcohol, ni consuma drogas.   °· Traumatismos   °A veces, la causa es desconocida.  °SÍNTOMAS  °· Sangrado o manchado vaginal, con o sin cólicos o dolor. °· Dolor o cólicos en el abdomen o en la cintura. °· Eliminación de líquido, tejidos o coágulos grandes por la vagina. °DIAGNÓSTICO  °El médico le hará un examen físico. También le indicará una ecografía para confirmar el aborto. Es posible que se realicen análisis de sangre.  °TRATAMIENTO  °· En algunos casos el tratamiento no es necesario, si se eliminan naturalmente todos los tejidos embrionarios que se encontraban en el útero. Si el feto o la placenta quedan dentro del útero (aborto incompleto), pueden infectarse, los tejidos que quedan  pueden infectarse y deben retirarse. Generalmente se realiza un procedimiento de dilatación y curetaje (D y C). Durante el procedimiento de dilatación y curetaje, el cuello del útero se abre (dilata) y se retira cualquier resto de tejido fetal o placentario del útero. °· Si hay una infección, le recetarán antibióticos. Podrán recetarle otros medicamentos para reducir el tamaño del útero (contraerlo) si hay una mucho sangrado. °· Si su sangre es Rh negativa y su bebé es Rh positivo, usted necesitará la inyección de inmunoglobulina Rh. Esta inyección protegerá a los futuros bebés de tener problemas de compatibilidad Rh en futuros embarazos. °INSTRUCCIONES PARA EL CUIDADO EN EL HOGAR  °· El médico le indicará reposo en cama o le permitirá realizar actividades livianas. Vuelva a la actividad lentamente o según las indicaciones de su médico. °· Pídale a alguien que la ayude con las responsabilidades familiares y del hogar durante este tiempo.   °· Lleve un registro de la cantidad y la saturación de las toallas higiénicas que utiliza cada día. Anote esta información   °· No use tampones. No No se haga duchas vaginales ni tenga relaciones sexuales hasta que el médico la autorice.   °· Sólo tome medicamentos de venta libre o recetados para calmar el dolor o el malestar, según las indicaciones de su médico.   °· No tome aspirina. La aspirina puede ocasionar hemorragias.   °· Concurra puntualmente a las citas de control con el médico.   °· Si usted o su pareja tienen dificultades con el duelo, hable con su médico para buscar la ayuda psicológica que los ayude a enfrentar la pérdida   del embarazo. Permítase el tiempo suficiente de duelo antes de quedar embarazada nuevamente.   °SOLICITE ATENCIÓN MÉDICA DE INMEDIATO SI:  °· Siente calambres intensos o dolor en la espalda o en el abdomen. °· Tiene fiebre. °· Elimina grandes coágulos de sangre (del tamaño de una nuez o más) o tejidos por la vagina. Guarde lo que ha eliminado para  que su médico lo examine.   °· La hemorragia aumenta.   °· Observa una secreción vaginal espesa y con mal olor. °· Se siente mareada, débil, o se desmaya.   °· Siente escalofríos.   °ASEGÚRESE DE QUE:  °· Comprende estas instrucciones. °· Controlará su enfermedad. °· Solicitará ayuda de inmediato si no mejora o si empeora. °Document Released: 09/12/2005 Document Revised: 03/30/2013 °ExitCare® Patient Information ©2015 [employer], LLC. This information is not intended to replace advice given to you by your health care provider. Make sure you discuss any questions you have with your health care provider. ° °

## 2015-04-06 NOTE — Progress Notes (Signed)
Patient ID: April GerlachMariana Jasso Andersen, female   DOB: 1976-02-11, 39 y.o.   MRN: [id number]  Chief Complaint  Patient presents with  . Follow-up    from MAU for SAB    HPI Arlyn DunningMariana Jasso [name] is a 39 y.o. female.  Diagnosed with complete miscarriage 2 weeks ago, has some dizziness and nausea now, no bleeding  HPI  Past Medical History  Diagnosis Date  . Endometriosis   . Medical history non-contributory     Past Surgical History  Procedure Laterality Date  . Laparoscopic abdominal exploration      No family history on file.  Social History History  Substance Use Topics  . Smoking status: Never Smoker   . Smokeless tobacco: Not on file  . Alcohol Use: No    No Known Allergies  Current Outpatient Prescriptions  Medication Sig Dispense Refill  . Prenat w/o A Vit-FeFum-FePo-FA (CONCEPT OB) 130-92.4-1 MG CAPS Take 1 tablet by mouth daily. 30 capsule 12   No current facility-administered medications for this visit.    Review of Systems Review of Systems  Gastrointestinal: Positive for nausea.  Neurological: Positive for dizziness.    Blood pressure 117/78, pulse 86, temperature 98.6 F (37 C), temperature source Oral, height 5\' 8"  (1.727 m), weight 154 lb 11.2 oz (70.171 kg), last menstrual period 01/21/2015, unknown if currently breastfeeding.  Physical Exam Physical Exam  Constitutional: She is oriented to person, place, and time. She appears well-developed. No distress.  Pulmonary/Chest: Effort normal.  Abdominal: Soft. She exhibits no distension. There is no tenderness.  Neurological: She is alert and oriented to person, place, and time.  Psychiatric: She has a normal mood and affect. Her behavior is normal.    Data Reviewed HCG reviewed  Assessment    S/p miscarriage will check CBC and HCG     Plan    Notify of results        ARNOLD,JAMES 04/06/2015, 2:18 PM

## 2015-04-07 LAB — HCG, QUANTITATIVE, PREGNANCY

## 2015-04-20 ENCOUNTER — Ambulatory Visit (INDEPENDENT_AMBULATORY_CARE_PROVIDER_SITE_OTHER): Payer: Self-pay | Admitting: Physician Assistant

## 2015-04-20 VITALS — BP 102/68 | HR 87 | Temp 98.7°F | Resp 18 | Ht 68.5 in | Wt 156.0 lb

## 2015-04-20 DIAGNOSIS — Z8759 Personal history of other complications of pregnancy, childbirth and the puerperium: Secondary | ICD-10-CM

## 2015-04-20 DIAGNOSIS — F4329 Adjustment disorder with other symptoms: Secondary | ICD-10-CM

## 2015-04-20 DIAGNOSIS — R1030 Lower abdominal pain, unspecified: Secondary | ICD-10-CM

## 2015-04-20 DIAGNOSIS — R6883 Chills (without fever): Secondary | ICD-10-CM

## 2015-04-20 DIAGNOSIS — Z8742 Personal history of other diseases of the female genital tract: Secondary | ICD-10-CM

## 2015-04-20 DIAGNOSIS — R631 Polydipsia: Secondary | ICD-10-CM

## 2015-04-20 LAB — COMPREHENSIVE METABOLIC PANEL
ALT: 13 U/L (ref 0–35)
AST: 17 U/L (ref 0–37)
Albumin: 4.3 g/dL (ref 3.5–5.2)
Alkaline Phosphatase: 38 U/L — ABNORMAL LOW (ref 39–117)
BILIRUBIN TOTAL: 0.7 mg/dL (ref 0.2–1.2)
BUN: 11 mg/dL (ref 6–23)
CALCIUM: 9.2 mg/dL (ref 8.4–10.5)
CHLORIDE: 104 meq/L (ref 96–112)
CO2: 27 meq/L (ref 19–32)
CREATININE: 0.84 mg/dL (ref 0.50–1.10)
GLUCOSE: 99 mg/dL (ref 70–99)
Potassium: 4.4 mEq/L (ref 3.5–5.3)
Sodium: 139 mEq/L (ref 135–145)
Total Protein: 7.4 g/dL (ref 6.0–8.3)

## 2015-04-20 LAB — POCT URINALYSIS DIPSTICK
Bilirubin, UA: NEGATIVE
GLUCOSE UA: NEGATIVE
Ketones, UA: NEGATIVE
Leukocytes, UA: NEGATIVE
Nitrite, UA: NEGATIVE
SPEC GRAV UA: 1.015
UROBILINOGEN UA: 0.2
pH, UA: 7.5

## 2015-04-20 LAB — POCT UA - MICROSCOPIC ONLY
Casts, Ur, LPF, POC: NEGATIVE
Crystals, Ur, HPF, POC: NEGATIVE
Mucus, UA: NEGATIVE
Yeast, UA: NEGATIVE

## 2015-04-20 LAB — POCT CBC
Granulocyte percent: 56.8 %G (ref 37–80)
HCT, POC: 40.2 % (ref 37.7–47.9)
Hemoglobin: 13.5 g/dL (ref 12.2–16.2)
Lymph, poc: 1.4 (ref 0.6–3.4)
MCH: 29 pg (ref 27–31.2)
MCHC: 33.5 g/dL (ref 31.8–35.4)
MCV: 86.5 fL (ref 80–97)
MID (cbc): 0.3 (ref 0–0.9)
MPV: 8.7 fL (ref 0–99.8)
PLATELET COUNT, POC: 278 10*3/uL (ref 142–424)
POC Granulocyte: 2.3 (ref 2–6.9)
POC LYMPH %: 35 % (ref 10–50)
POC MID %: 8.2 %M (ref 0–12)
RBC: 4.64 M/uL (ref 4.04–5.48)
RDW, POC: 13.2 %
WBC: 4.1 10*3/uL — AB (ref 4.6–10.2)

## 2015-04-20 LAB — POCT WET PREP WITH KOH
Bacteria Wet Prep HPF POC: NEGATIVE
KOH Prep POC: NEGATIVE
RBC WET PREP PER HPF POC: NEGATIVE
Trichomonas, UA: NEGATIVE
Yeast Wet Prep HPF POC: NEGATIVE

## 2015-04-20 LAB — POCT GLYCOSYLATED HEMOGLOBIN (HGB A1C): HEMOGLOBIN A1C: 5.2

## 2015-04-20 LAB — POCT URINE PREGNANCY: Preg Test, Ur: NEGATIVE

## 2015-04-20 LAB — TSH: TSH: 1.288 u[IU]/mL (ref 0.350–4.500)

## 2015-04-20 MED ORDER — NITROFURANTOIN MONOHYD MACRO 100 MG PO CAPS: 100.0000 mg | ORAL_CAPSULE | Freq: Two times a day (BID) | ORAL | Status: DC

## 2015-04-20 NOTE — Progress Notes (Signed)
04/20/2015 at 12:44 PM  April GerlachMariana Andersen Andersen / DOB: [birth year] / MRN: [id number]  The patient has Complete miscarriage on her problem list.  SUBJECTIVE  Chief complaint: Night Sweats; Insomnia; and thirsty a lot  [facility] Carlynn Andersen is a 39 y.o. female here today with complaints of excessive thirst.  She reports she is unable to sleep due to her thirst.  Associated symptoms include mild dizziness, occasional nausea, and some tingling in her feet.  Her mother has a history of diabetes type 2.  She was recently pregnant and carried the baby for roughly 2 months and suffered a spontaneous abortion on 03/24/15.  She was evaluated for this two weeks later and her CBC was normal at that time. She denies depression and anhedonia. She would like to speak with a counselor regarding the stress associated with losing the fetus.    She  has a past medical history of Endometriosis and Medical history non-contributory.    Medications reviewed and updated by myself where necessary, and exist elsewhere in the encounter.   [name] has No Known Allergies. She  reports that she has never smoked. She does not have any smokeless tobacco history on file. She reports that she does not drink alcohol or use illicit drugs. She  reports that she currently engages in sexual activity. The patient  has past surgical history that includes Laparoscopic abdominal exploration.  Her family history includes COPD in her father; Diabetes in her mother; Hyperlipidemia in her mother; Hypertension in her mother; Prostatitis in her father; Thyroid disease in her mother.  Review of Systems  Constitutional: Positive for chills, malaise/fatigue and diaphoresis.  Respiratory: Negative for cough and shortness of breath.   Cardiovascular: Negative for chest pain and leg swelling.  Gastrointestinal: Positive for nausea. Negative for abdominal pain and blood in stool.  Genitourinary: Positive for frequency. Negative for dysuria, urgency,  hematuria and flank pain.  Musculoskeletal: Negative for myalgias.  Skin: Negative.   Neurological: Positive for dizziness and headaches. Negative for focal weakness.  Psychiatric/Behavioral: Negative for depression.    OBJECTIVE  Her  height is 5' 8.5" (1.74 m) and weight is 156 lb (70.761 kg). Her oral temperature is 98.7 F (37.1 C). Her blood pressure is 102/68 and her pulse is 87. Her respiration is 18 and oxygen saturation is 98%.  The patient's body mass index is 23.37 kg/(m^2).  Physical Exam  Constitutional: She is oriented to person, place, and time. She appears well-developed.  Respiratory: Effort normal.  GI: Soft. There is tenderness in the suprapubic area. There is no CVA tenderness.  Genitourinary: Cervix exhibits no motion tenderness, no discharge and no friability. Right adnexum displays no mass, no tenderness and no fullness. Left adnexum displays tenderness. Left adnexum displays no mass and no fullness.    Musculoskeletal: Normal range of motion.  Neurological: She is alert and oriented to person, place, and time.  Skin: Skin is warm and dry. She is not diaphoretic.  Psychiatric: She has a normal mood and affect.    Results for orders placed or performed in visit on 04/20/15 (from the past 24 hour(s))  POCT CBC     Status: Abnormal   Collection Time: 04/20/15 10:00 AM  Result Value Ref Range   WBC 4.1 (A) 4.6 - 10.2 K/uL   Lymph, poc 1.4 0.6 - 3.4   POC LYMPH PERCENT 35.0 10 - 50 %L   MID (cbc) 0.3 0 - 0.9   POC MID % 8.2 0 -  12 %M   POC Granulocyte 2.3 2 - 6.9   Granulocyte percent 56.8 37 - 80 %G   RBC 4.64 4.04 - 5.48 M/uL   Hemoglobin 13.5 12.2 - 16.2 g/dL   HCT, POC 60.440.2 54.037.7 - 47.9 %   MCV 86.5 80 - 97 fL   MCH, POC 29.0 27 - 31.2 pg   MCHC 33.5 31.8 - 35.4 g/dL   RDW, POC 98.113.2 %   Platelet Count, POC 278 142 - 424 K/uL   MPV 8.7 0 - 99.8 fL  POCT glycosylated hemoglobin (Hb A1C)     Status: None   Collection Time: 04/20/15 10:00 AM  Result  Value Ref Range   Hemoglobin A1C 5.2   POCT urinalysis dipstick     Status: None   Collection Time: 04/20/15 10:00 AM  Result Value Ref Range   Color, UA yellow    Clarity, UA clear    Glucose, UA neg    Bilirubin, UA neg    Ketones, UA neg    Spec Grav, UA 1.015    Blood, UA large    pH, UA 7.5    Protein, UA trace    Urobilinogen, UA 0.2    Nitrite, UA neg    Leukocytes, UA Negative   POCT UA - Microscopic Only     Status: None   Collection Time: 04/20/15 10:00 AM  Result Value Ref Range   WBC, Ur, HPF, POC 8-12    RBC, urine, microscopic 6-10    Bacteria, U Microscopic few    Mucus, UA neg    Epithelial cells, urine per micros 5-10    Crystals, Ur, HPF, POC neg    Casts, Ur, LPF, POC neg    Yeast, UA neg   POCT urine pregnancy     Status: None   Collection Time: 04/20/15 10:00 AM  Result Value Ref Range   Preg Test, Ur Negative   POCT Wet Prep with KOH     Status: None   Collection Time: 04/20/15 10:55 AM  Result Value Ref Range   Trichomonas, UA Negative    Clue Cells Wet Prep HPF POC 0-1    Epithelial Wet Prep HPF POC 0-2    Yeast Wet Prep HPF POC neg    Bacteria Wet Prep HPF POC neg    RBC Wet Prep HPF POC neg    WBC Wet Prep HPF POC 0-2    KOH Prep POC Negative     ASSESSMENT & PLAN  April Andersen was seen today for night sweats, insomnia and excessive thirst.  Plan outlined below. This patient's physical exam and work up were negative.  My biggest concern was diabetes, however A1C is WNL.  Urine questionable for infection but was also a dirty catch. Culture out.  Pelvic exam without exquisite tenderness, but some mild tenderness on the left adnexa, however I doubt this tenderness if of concern given her history of endometriosis and negative white count.  Her symptoms may represent a somatic stress response, as she has been try to have a child for five years.  Will cover for UTI with nitrofurantoin and await culture results.   Diagnoses and all orders for this  visit:  Polydipsia Orders: -     POCT glycosylated hemoglobin (Hb A1C)  Chills (without fever) Orders: -     POCT CBC -     POCT urinalysis dipstick -     POCT UA - Microscopic Only -     Comprehensive metabolic  panel -     POCT urine pregnancy -     TSH -     POCT Wet Prep with KOH -     Urine culture  Suprapubic pressure, unspecified laterality: Patient with questionable urine.  Culture initiated.  Will treat empirically.  Orders: -     nitrofurantoin, macrocrystal-monohydrate, (MACROBID) 100 MG capsule; Take 1 capsule (100 mg total) by mouth 2 (two) times daily.  Stress and adjustment reaction: Orders: -     Ambulatory referral to Psychology  Adnexal tenderness left:  Patient with history of endometriosis.  Should her urine fail to explain her symptoms will refer back to gyn for further evaluation.    The patient was advised to call or come back to clinic if she does not see an improvement in symptoms, or worsens with the above plan.   Deliah Boston, MHS, PA-C [facility] 04/20/2015 12:44 PM

## 2015-04-22 LAB — URINE CULTURE

## 2016-04-13 ENCOUNTER — Ambulatory Visit (INDEPENDENT_AMBULATORY_CARE_PROVIDER_SITE_OTHER): Payer: Self-pay | Admitting: Physician Assistant

## 2016-04-13 VITALS — BP 104/62 | HR 87 | Temp 98.0°F | Resp 16 | Ht 68.0 in | Wt 148.5 lb

## 2016-04-13 DIAGNOSIS — N644 Mastodynia: Secondary | ICD-10-CM

## 2016-04-13 DIAGNOSIS — N3090 Cystitis, unspecified without hematuria: Secondary | ICD-10-CM

## 2016-04-13 DIAGNOSIS — R300 Dysuria: Secondary | ICD-10-CM

## 2016-04-13 DIAGNOSIS — L918 Other hypertrophic disorders of the skin: Secondary | ICD-10-CM

## 2016-04-13 LAB — POC MICROSCOPIC URINALYSIS (UMFC): Mucus: ABSENT

## 2016-04-13 LAB — POCT URINALYSIS DIP (MANUAL ENTRY)
BILIRUBIN UA: NEGATIVE
Bilirubin, UA: NEGATIVE
Glucose, UA: NEGATIVE
NITRITE UA: POSITIVE — AB
PH UA: 6
Spec Grav, UA: <=1.005
Urobilinogen, UA: 0.2

## 2016-04-13 LAB — POCT URINE PREGNANCY: Preg Test, Ur: NEGATIVE

## 2016-04-13 MED ORDER — NITROFURANTOIN MONOHYD MACRO 100 MG PO CAPS: 100.0000 mg | ORAL_CAPSULE | Freq: Two times a day (BID) | ORAL | Status: AC

## 2016-04-13 NOTE — Progress Notes (Signed)
[facility] [address], [place] [address] [phone]  Date:  04/13/2016   Name:  April Andersen   DOB:  1975/12/26   MRN:  [id number]  PCP:  No primary care provider on file.    Chief Complaint: Abdominal Pain; Dysuria; and Breast Pain   History of Present Illness:  This is a 40 y.o. female who is presenting with back pain that radiates into her abdomen and dysuria for 2 days. Having urinary freq. Back pain is midline lower in location and shoots into her midline lower abdomen.  Denies hematuria, fever, chills, n/v, vaginal discharge. LMP 03/26/16, normal period for her Sexually active with her husband. Do not use protection, states she is not doing anything to prevent pregnancy but not necessarily trying to get pregnant either.  She is also complaining of breast pain that started 3 weeks ago prior to her menstrual cycle. Breast pain is getting better but still there. This is not normal for her. There are no skin changes. Denies nipple discharge. She is also complaining of more frequent headaches and she is more fatigued.   She is also wondering if we can take off a skin tag on her back. It has been there several years but is getting a little bigger and is getting caught on her bra.  Review of Systems:  Review of Systems See HPI  Patient Active Problem List   Diagnosis Date Noted  . Complete miscarriage 04/06/2015    Prior to Admission medications   Medication Sig Start Date End Date Taking? Authorizing Provider  Prenat w/o A Vit-FeFum-FePo-FA (CONCEPT OB) 130-92.4-1 MG CAPS Take 1 tablet by mouth daily. 03/19/15  Yes Dorathy KinsmanVirginia Smith, CNM    No Known Allergies  Past Surgical History  Procedure Laterality Date  . Laparoscopic abdominal exploration      Social History  Substance Use Topics  . Smoking status: Never Smoker   . Smokeless tobacco: None  . Alcohol Use: No    Family History  Problem Relation Age of Onset  . Diabetes Mother    . Hyperlipidemia Mother   . Hypertension Mother   . Thyroid disease Mother   . Prostatitis Father   . COPD Father     Medication list has been reviewed and updated.  Physical Examination:  Physical Exam  Constitutional: She is oriented to person, place, and time. She appears well-developed and well-nourished. No distress.  HENT:  Head: Normocephalic and atraumatic.  Right Ear: Hearing normal.  Left Ear: Hearing normal.  Nose: Nose normal.  Eyes: Conjunctivae and lids are normal. Right eye exhibits no discharge. Left eye exhibits no discharge. No scleral icterus.  Cardiovascular: Normal rate, regular rhythm, normal heart sounds and normal pulses.   No murmur heard. Pulmonary/Chest: Effort normal and breath sounds normal. No respiratory distress. She has no wheezes. She has no rhonchi. She has no rales. Right breast exhibits no inverted nipple, no mass, no nipple discharge, no skin change and no tenderness. Left breast exhibits no inverted nipple, no mass, no nipple discharge, no skin change and no tenderness. Breasts are symmetrical.  No breast tenderness with exam  Abdominal: Soft. Normal appearance. There is tenderness in the suprapubic area. There is no CVA tenderness.  Musculoskeletal: Normal range of motion.  Neurological: She is alert and oriented to person, place, and time.  Skin: Skin is warm, dry and intact.  Skin tag on mid back  Psychiatric: She has a normal mood and affect. Her  speech is normal and behavior is normal. Thought content normal.   BP 104/62 mmHg  Pulse 87  Temp(Src) 98 F (36.7 C) (Oral)  Resp 16  Ht  (1.727 m)  Wt 148 lb 8 oz (67.359 kg)  BMI 22.58 kg/m2  SpO2 98%  LMP 03/26/2016  Procedure: Verbal consent obtained. Skin cleaned with alcohol and anesthetized with 1 cc 1% lido with epi. Sterile field applied. Shave biopsy performed on skin tag. Hemostasis achieved with application drysol. Wound dressed and wound care discussed.   Results for  orders placed or performed in visit on 04/13/16  POCT urinalysis dipstick  Result Value Ref Range   Color, UA yellow yellow   Clarity, UA cloudy (A) clear   Glucose, UA negative negative   Bilirubin, UA negative negative   Ketones, POC UA negative negative   Spec Grav, UA <=1.005    Blood, UA trace-lysed (A) negative   pH, UA 6.0    Protein Ur, POC trace (A) negative   Urobilinogen, UA 0.2    Nitrite, UA Positive (A) Negative   Leukocytes, UA small (1+) (A) Negative  POCT Microscopic Urinalysis ([facility])  Result Value Ref Range   WBC,UR,HPF,POC Too numerous to count  (A) None WBC/hpf   RBC,UR,HPF,POC None None RBC/hpf   Bacteria Many (A) None, Too numerous to count   Mucus Absent Absent   Epithelial Cells, UR Per Microscopy Many (A) None, Too numerous to count cells/hpf  POCT urine pregnancy  Result Value Ref Range   Preg Test, Ur Negative Negative   Assessment and Plan:  1. Cystitis 2. Dysuria  UA suggestive of UA, treat with macrobid bid x 7 days. Urine culture pending. Counseled on hydration, cranberry juice/pills, AZO. Return in 7 days if symptoms do not improve or at any time if symptoms worsen.  - Urine culture - nitrofurantoin, macrocrystal-monohydrate, (MACROBID) 100 MG capsule; Take 1 capsule (100 mg total) by mouth 2 (two) times daily.  Dispense: 14 capsule; Refill: 0 - POCT urinalysis dipstick - POCT Microscopic Urinalysis ([facility]) - POCT urine pregnancy  3. Breast pain Likely related to fibrocystic changes. Breast pain is improving. Advised she take ibuprofen 400 mg TID prior to starting her periods and continue a few days into period. Counseled on abstaining from chocolate and caffeine. Counseled on supportive bra. If continues to have abnormal breast pain, she will let me know and I will refer for mammogram. She is getting close to screening age anyway.  4. Skin tag Skin tag removed. Wound care discussed. Did not send for derm path since self pay.   [place] Miners  Dyke Brackett, MHS [facility]  04/13/2016

## 2016-04-13 NOTE — Patient Instructions (Addendum)
Drink plenty of water (at least 64 oz per day). Take antibiotic as prescribed until finished - twice a day for 7 days Cranberry juice/pills can help. You can buy AZO over the counter and take as needed for pain. Follow directions on the packaging. I will call you with results from your urine culture. If symptoms are not improving in 1 week, return to clinic.  Monitor your breast tenderness. May take ibuprofen as needed, starting a day or two prior to your period starting and continue for a few days into your period If you continue to have abnormal breast tenderness, let me know and I will send you for breast imaging  For your back -- keep dry for 24 hours. AFter that you may get in the shower. Change dressing daily. May put antibacterial cream like neosporin on daily.  PATIENT INSTRUCTIONS        Breast pain  DIET:  You should try and avoid foods, or at least minimize foods, such as chocolate and caffeine which may cause the symptoms of tenderness to become worse.  ACTIVITY:  You may want to wear a bra that offers additional support, and/or consider a sports bra, especially during those times when your breasts are more tender.  QUESTIONS:  Please feel free to call your physician  if you have any questions, and they will be glad to assist you.     IF you received an x-ray today, you will receive an invoice from [facility]. Please contact [facility] at [phone] with questions or concerns regarding your invoice.   IF you received labwork today, you will receive an invoice from [facility]. Please contact [facility] at [phone] with questions or concerns regarding your invoice.   Our billing staff will not be able to assist you with questions regarding bills from these companies.  You will be contacted with the lab results as soon as they are available. The fastest way to get your results is to activate your My Chart account. Instructions  are located on the last page of this paperwork. If you have not heard from us regarding the results in 2 weeks, please contact this office.

## 2016-04-15 LAB — URINE CULTURE

## 2016-04-23 ENCOUNTER — Telehealth: Payer: Self-pay

## 2016-04-23 ENCOUNTER — Other Ambulatory Visit: Payer: Self-pay | Admitting: Physician Assistant

## 2016-04-23 DIAGNOSIS — N3000 Acute cystitis without hematuria: Secondary | ICD-10-CM

## 2016-04-23 MED ORDER — CIPROFLOXACIN HCL 500 MG PO TABS: 500.0000 mg | ORAL_TABLET | Freq: Two times a day (BID) | ORAL | Status: AC

## 2016-04-23 NOTE — Telephone Encounter (Signed)
Pt came into office saying she is having continued urinary symptoms. Spoke to [name] Clark. [name] reviewed pt chart and prescribed pt 5 days worth of Cipro. Advised pt if not feeling better after second round of antibiotics she will need to be seen. Pt voiced understanding.

## 2016-04-23 NOTE — Progress Notes (Signed)
Spoke with April Andersen who advised patient refused to be seen today.  Will try another antibiotic for UTI with positive culture.  She is not breast feeding. [name], MS, PA-C 2:10 PM, 04/23/2016

## 2016-04-28 ENCOUNTER — Telehealth: Payer: Self-pay

## 2016-04-28 NOTE — Telephone Encounter (Signed)
Patient started taking ciprofloxain on 04/23/16 and finished 5 day supply on 04/27/16. Patient states she thinks she was taking this medication incorrectly, she was taking it with the vitamines instead of 2 hour before or 6 hours after. She feels better but would like two more days to finish off medication. Please, advise patient

## 2016-04-28 NOTE — Telephone Encounter (Signed)
Spoke with pt she states that she is still having burning sensation.  She was taking the vitamins for 2 days when she first started and thinks that she needs 2 days to cover the 2 days she was taking the vitamins.  I advised that she probably just needs to come back in.  What would you advise?  I do not think that 2 more days will do any good, she needs a recheck.

## 2016-04-28 NOTE — Telephone Encounter (Signed)
Spoke with Chelle about this matter and she states that the patient needs to be seen.  She has been on 2 courses of antibiotics already.   No answer/No vm

## 2016-04-30 ENCOUNTER — Ambulatory Visit (INDEPENDENT_AMBULATORY_CARE_PROVIDER_SITE_OTHER): Payer: Self-pay | Admitting: Physician Assistant

## 2016-04-30 VITALS — BP 116/78 | HR 83 | Temp 98.8°F | Resp 16 | Ht 68.0 in | Wt 151.0 lb

## 2016-04-30 DIAGNOSIS — R300 Dysuria: Secondary | ICD-10-CM

## 2016-04-30 LAB — POCT URINALYSIS DIP (MANUAL ENTRY)
BILIRUBIN UA: NEGATIVE
GLUCOSE UA: NEGATIVE
Ketones, POC UA: NEGATIVE
Leukocytes, UA: NEGATIVE
NITRITE UA: NEGATIVE
Protein Ur, POC: NEGATIVE
Spec Grav, UA: 1.015
UROBILINOGEN UA: 0.2
pH, UA: 7

## 2016-04-30 LAB — POC MICROSCOPIC URINALYSIS (UMFC): MUCUS RE: ABSENT

## 2016-04-30 MED ORDER — CIPROFLOXACIN HCL 250 MG PO TABS: 250.0000 mg | ORAL_TABLET | Freq: Two times a day (BID) | ORAL | Status: DC

## 2016-04-30 NOTE — Patient Instructions (Addendum)
Take cipro twice a day for next 3 days. I will call you with your lab results. If not getting better after antibiotics, let me know and i will refer to urologist    IF you received an x-ray today, you will receive an invoice from [facility]. Please contact [facility] at [phone] with questions or concerns regarding your invoice.   IF you received labwork today, you will receive an invoice from [facility]. Please contact [facility] at [phone] with questions or concerns regarding your invoice.   Our billing staff will not be able to assist you with questions regarding bills from these companies.  You will be contacted with the lab results as soon as they are available. The fastest way to get your results is to activate your My Chart account. Instructions are located on the last page of this paperwork. If you have not heard from us regarding the results in 2 weeks, please contact this office.

## 2016-04-30 NOTE — Progress Notes (Signed)
[facility] [address], [place] [address] [phone]  Date:  04/30/2016   Name:  April GerlachMariana Jasso Andersen   DOB:  04-09-1976   MRN:  [id number]  PCP:  No primary care provider on file.    Chief Complaint: Dysuria   History of Present Illness:  This is a 40 y.o. female who is presenting for follow up dysuria. I saw pt on 4/28 and diagnosed with UTI, placed empirically on macrobid while awaiting culture. Culture grew out E coli and showed sensitivity to macrobid. She apparently called on 5/8 stating her symptoms were not better. PA Deliah BostonMichael Clark called in 5 day course of cipro, which was also sensitive on urine culture. She called back stating she still was not better. She was then advised to return to clinic. She is here stating she feels a burning inside her urethra. It feels better when she urinates. She has some urinary freq. No vaginal discharge. Vulva does not feel irritated. She is currently on her period. She has not had STD in a while. She is sexually active with her husband. Pt is worried she did not take the cipro correctly -- she took at same time as her vitamins and the bottle says not to do this.  Review of Systems:  Review of Systems See HPI  Patient Active Problem List   Diagnosis Date Noted  . Complete miscarriage 04/06/2015    Prior to Admission medications   Medication Sig Start Date End Date Taking? Authorizing Provider  Prenat w/o A Vit-FeFum-FePo-FA (CONCEPT OB) 130-92.4-1 MG CAPS Take 1 tablet by mouth daily. 03/19/15  Yes Dorathy KinsmanVirginia Smith, CNM    No Known Allergies  Past Surgical History  Procedure Laterality Date  . Laparoscopic abdominal exploration      Social History  Substance Use Topics  . Smoking status: Never Smoker   . Smokeless tobacco: None  . Alcohol Use: No    Family History  Problem Relation Age of Onset  . Diabetes Mother   . Hyperlipidemia Mother   . Hypertension Mother   . Thyroid disease Mother   .  Prostatitis Father   . COPD Father     Medication list has been reviewed and updated.  Physical Examination:  Physical Exam  Constitutional: She is oriented to person, place, and time. She appears well-developed and well-nourished. No distress.  HENT:  Head: Normocephalic and atraumatic.  Right Ear: Hearing normal.  Left Ear: Hearing normal.  Nose: Nose normal.  Eyes: Conjunctivae and lids are normal. Right eye exhibits no discharge. Left eye exhibits no discharge. No scleral icterus.  Pulmonary/Chest: Effort normal. No respiratory distress.  Musculoskeletal: Normal range of motion.  Neurological: She is alert and oriented to person, place, and time.  Skin: Skin is warm, dry and intact. No lesion and no rash noted.  Psychiatric: She has a normal mood and affect. Her speech is normal and behavior is normal. Thought content normal.    BP 116/78 mmHg  Pulse 83  Temp(Src) 98.8 F (37.1 C) (Oral)  Resp 16  Ht 5\' 8"  (1.727 m)  Wt 151 lb (68.493 kg)  BMI 22.96 kg/m2  SpO2 99%  LMP 04/27/2016  Results for orders placed or performed in visit on 04/30/16  POCT urinalysis dipstick  Result Value Ref Range   Color, UA yellow yellow   Clarity, UA cloudy (A) clear   Glucose, UA negative negative   Bilirubin, UA negative negative   Ketones, POC UA negative negative  Spec Grav, UA 1.015    Blood, UA moderate (A) negative   pH, UA 7.0    Protein Ur, POC negative negative   Urobilinogen, UA 0.2    Nitrite, UA Negative Negative   Leukocytes, UA Negative Negative  POCT Microscopic Urinalysis ([facility])  Result Value Ref Range   WBC,UR,HPF,POC None None WBC/hpf   RBC,UR,HPF,POC Moderate (A) None RBC/hpf   Bacteria None None, Too numerous to count   Mucus Absent Absent   Epithelial Cells, UR Per Microscopy None None, Too numerous to count cells/hpf   Assessment and Plan:  .1. Dysuria UA normal except for blood (pt on her period). She self-collected a wet prep but unable to examine  accurately under microscope d/t abundant red cells. G/C pending. Pt worried she did not take cipro correctly since she took at the same time as her vitamins and the bottle says not to do this. Will treat with 3 more days of cipro and she will take without the vitamins. If still not better in 3 days and G/C negative, will refer to urology at that time. - POCT urinalysis dipstick - POCT Microscopic Urinalysis ([facility]) - GC/Chlamydia Probe Amp - ciprofloxacin (CIPRO) 250 MG tablet; Take 1 tablet (250 mg total) by mouth 2 (two) times daily.  Dispense: 6 tablet; Refill: 0   [place] Miners. Dyke Brackett, MHS [facility]  04/30/2016

## 2016-04-30 NOTE — Telephone Encounter (Signed)
Spoke with patient and informed her that follow up is needed given that she has been on abx x 2 with no relief in sxs. Patient agreed to come back into today. I informed patient that there is a follow up charge but I am unaware of the cost.

## 2016-05-02 LAB — GC/CHLAMYDIA PROBE AMP
CT Probe RNA: NOT DETECTED
GC Probe RNA: NOT DETECTED

## 2016-05-03 ENCOUNTER — Telehealth: Payer: Self-pay

## 2016-05-03 ENCOUNTER — Other Ambulatory Visit: Payer: Self-pay | Admitting: Physician Assistant

## 2016-05-03 DIAGNOSIS — B379 Candidiasis, unspecified: Secondary | ICD-10-CM

## 2016-05-03 MED ORDER — FLUCONAZOLE 150 MG PO TABS: 150.0000 mg | ORAL_TABLET | Freq: Once | ORAL | Status: DC

## 2016-05-03 NOTE — Telephone Encounter (Signed)
Called pt & gave results.  Pt states she thinks it is "outside" like a yeast infection which she has had before.  States she is having burning, odorous urine and back pain. States she did not finish the Cipro.  I strongly encouraged her to finish the antibiotic and gave explanations why. She declines coming back into the clinic as she said she has spent so much money.  Wants to know if [facility] that is on her chart.

## 2016-06-20 IMAGING — US US OB TRANSVAGINAL
1 series · 13 of 28 positions shown · non-contrast
Comparison: None for this pregnancy

CLINICAL DATA: Vaginal bleeding, positive pregnancy test

EXAM:
OBSTETRIC <14 WK US AND TRANSVAGINAL OB US
TECHNIQUE: Both transabdominal and transvaginal ultrasound examinations were
performed for complete evaluation of the gestation as well as the
maternal uterus, adnexal regions, and pelvic cul-de-sac.
Transvaginal technique was performed to assess early pregnancy.

[Series 1: us ob comp less 14 wks · 13 of 35 slices shown]
[im 2/35]
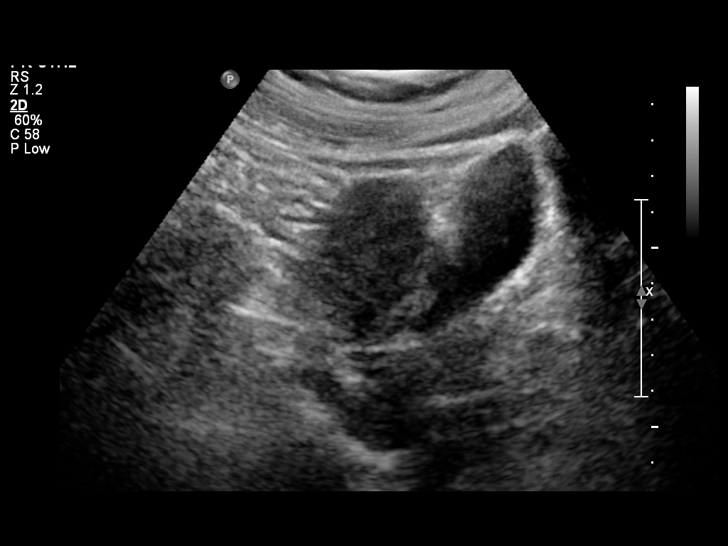
[im 4/35]
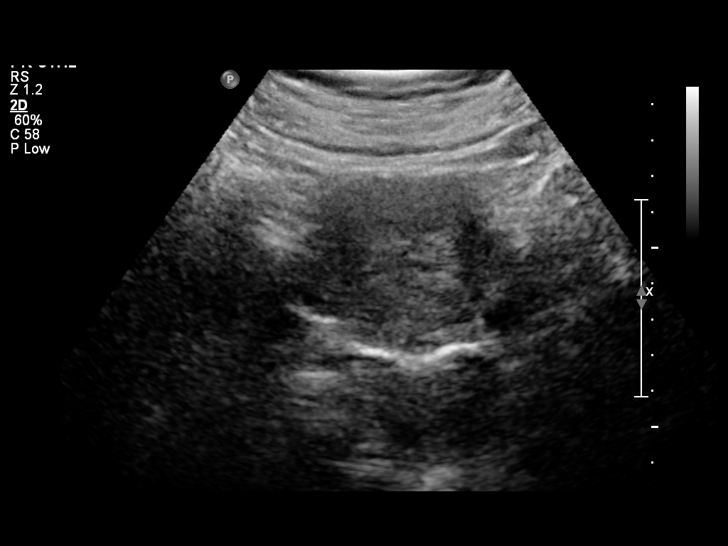
[im 7/35]
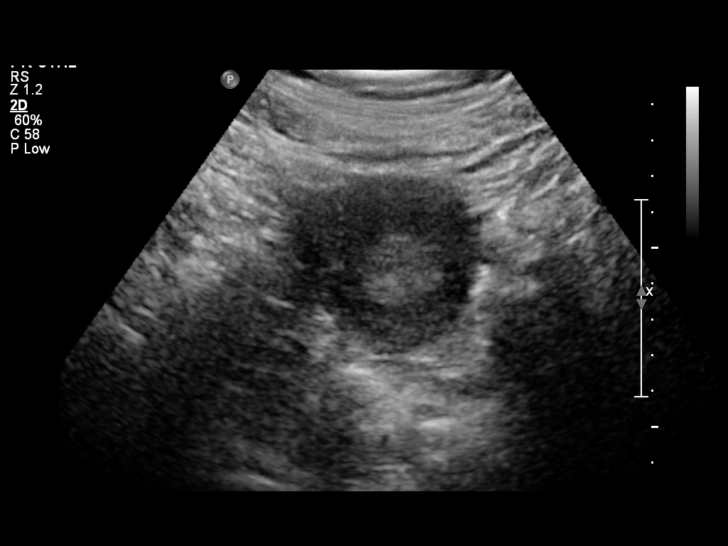
[im 9/35]
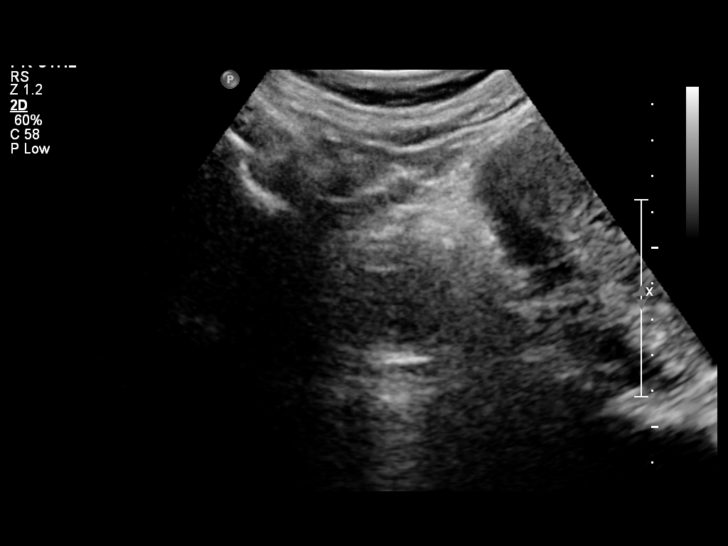
[im 12/35]
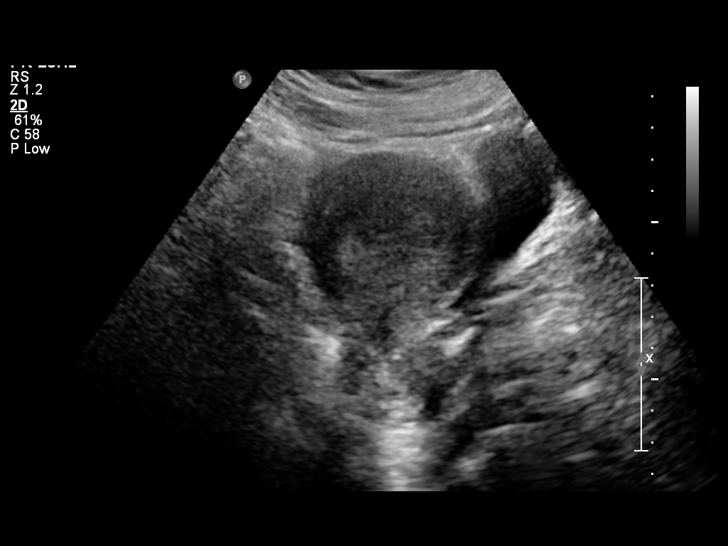
[im 14/35]
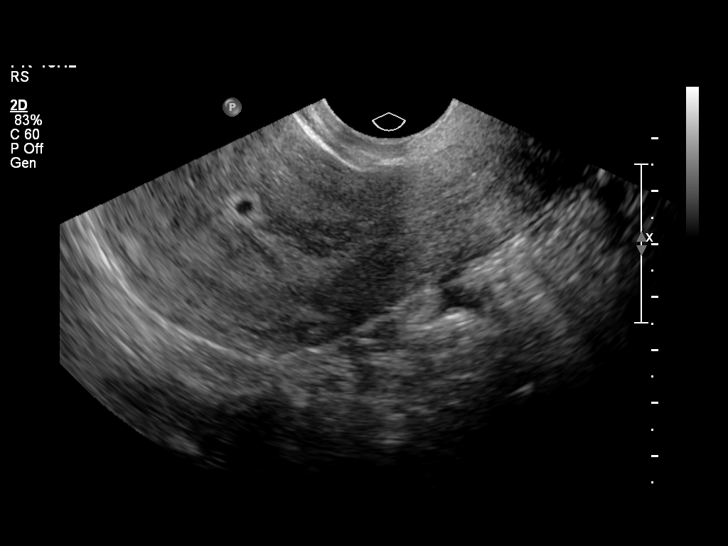
[im 18/35]
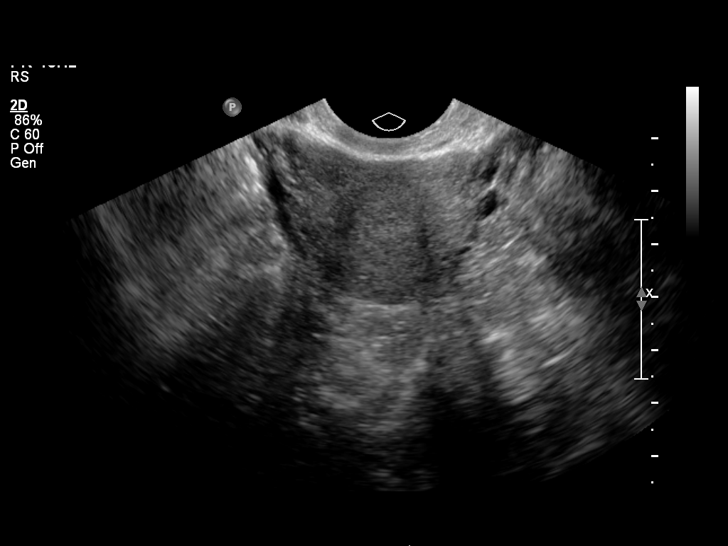
[im 21/35]
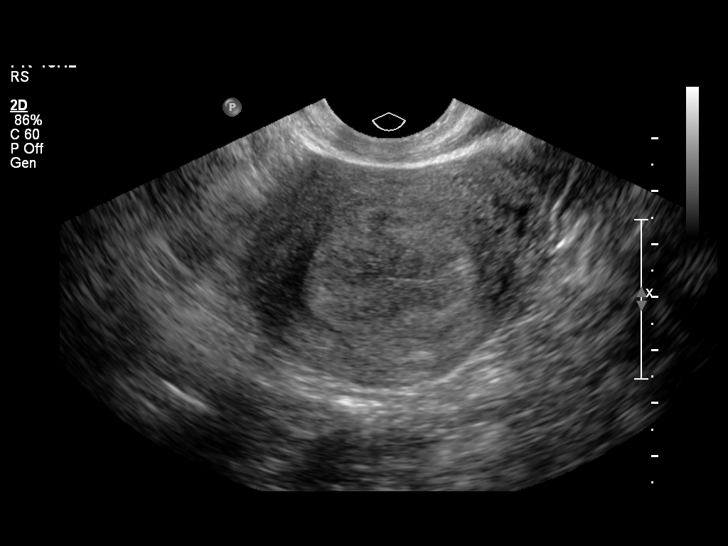
[im 23/35]
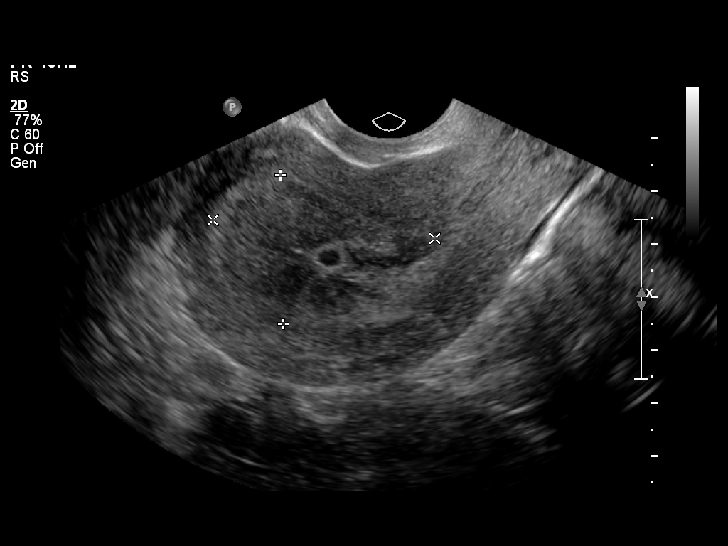
[im 26/35]
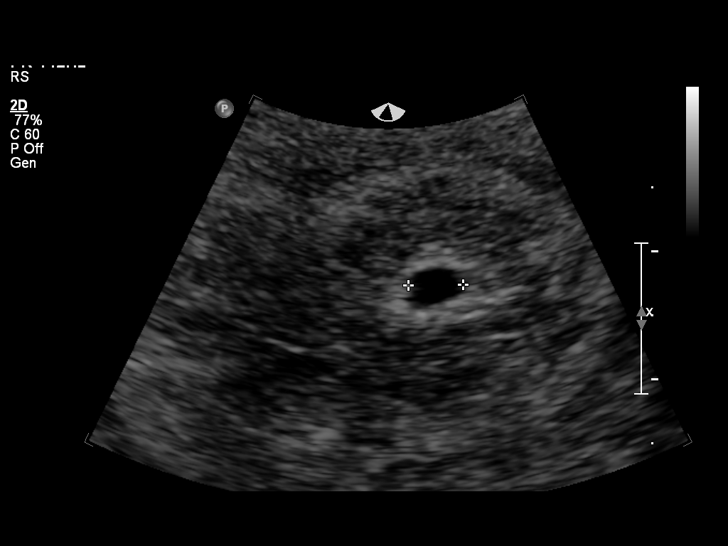
[im 28/35]
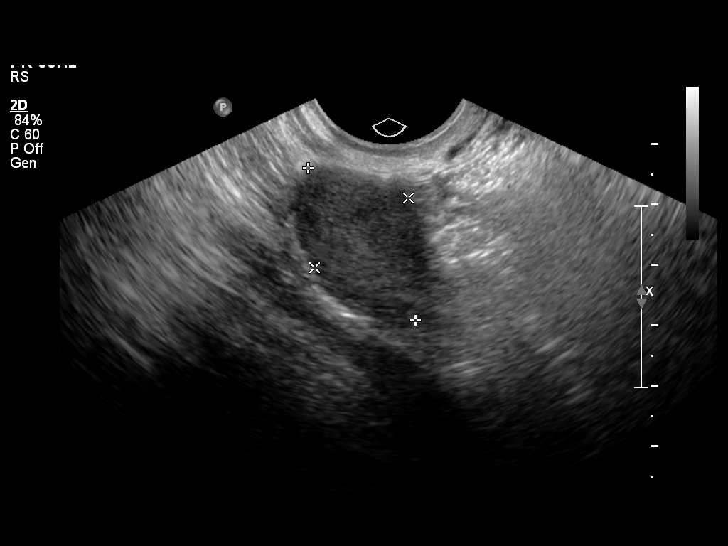
[im 31/35]
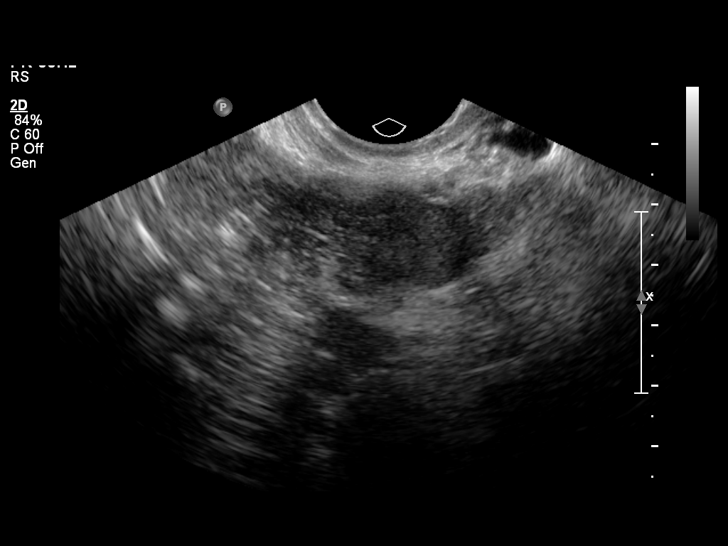
[im 33/35]
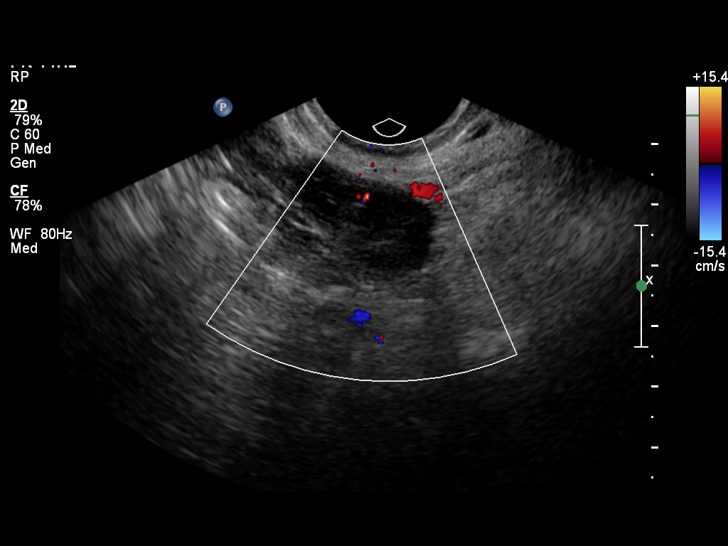

[13 of 28 positions shown; findings below may reference images not displayed]

FINDINGS: Intrauterine gestational sac: Visualized, mildly irregular in shape
and mobile within the endometrial canal at real time imaging
according to the sonographer. Small to moderate probable
subchorionic hemorrhage.

Yolk sac:  Not visualized

Embryo:  Not visualized

Cardiac Activity: Not visualized

MSD: 4  mm   4 w   6  d               US EDC: 11/20/2015

Maternal uterus/adnexae: Right ovarian corpus luteum. Left ovary is
normal. Trace free fluid in the cul-de-sac.
IMPRESSION: Mildly irregular, mobile intrauterine gestational sac but no yolk
sac, fetal pole, or cardiac activity yet visualized. Findings are
suspicious for, but not definitive of, failed intrauterine
pregnancy. Followup ultrasound is recommended in 10-14 days for
further assessment and to establish accurate dating.

## 2018-03-31 ENCOUNTER — Encounter: Payer: Self-pay | Admitting: Family Medicine

## 2018-03-31 ENCOUNTER — Ambulatory Visit: Payer: 59 | Admitting: Family Medicine

## 2018-03-31 VITALS — BP 116/70 | HR 91 | Temp 98.4°F | Resp 12 | Ht 67.0 in | Wt 161.1 lb

## 2018-03-31 DIAGNOSIS — Z131 Encounter for screening for diabetes mellitus: Secondary | ICD-10-CM | POA: Diagnosis not present

## 2018-03-31 DIAGNOSIS — N979 Female infertility, unspecified: Secondary | ICD-10-CM | POA: Diagnosis not present

## 2018-03-31 DIAGNOSIS — N809 Endometriosis, unspecified: Secondary | ICD-10-CM | POA: Diagnosis not present

## 2018-03-31 LAB — BASIC METABOLIC PANEL
BUN: 13 mg/dL (ref 6–23)
CALCIUM: 9.3 mg/dL (ref 8.4–10.5)
CO2: 27 mEq/L (ref 19–32)
CREATININE: 0.91 mg/dL (ref 0.40–1.20)
Chloride: 104 mEq/L (ref 96–112)
GFR: 72.69 mL/min (ref >60.00)
GLUCOSE: 80 mg/dL (ref 70–99)
Potassium: 4.2 mEq/L (ref 3.5–5.1)
SODIUM: 138 meq/L (ref 135–145)

## 2018-03-31 LAB — HEMOGLOBIN A1C: Hgb A1c MFr Bld: 5.3 % (ref 4.6–6.5)

## 2018-03-31 LAB — TSH: TSH: 1.43 u[IU]/mL (ref 0.35–4.50)

## 2018-03-31 NOTE — Progress Notes (Signed)
HPI:   April Andersen is a 42 y.o. female, who is here today to establish care.  Former PCP: N/A Last preventive routine visit: Not sure, 2016.  Chronic medical problems: Childhood asthma ,allergic rhinitis,and headaches among some.   Concerns today: She would like blood work. FHx of DM II.  Denies abdominal pain, nausea,vomiting, polydipsia,polyuria, or polyphagia.   Hx of endometriosis, has not been on birth control, she has been trying to get pregnant for years. She would like to have work up done to evaluate state of endometriosis. Symptoms stable.  + Dysmenorrhea. She takes OTC Advil prn.  Last pap smear 2 years ago.  Regular menses, 30/3 G:1 A:1 L:0 LMP 03/13/18.  She exercises regularly and follows a healthy diet.    Review of Systems  Constitutional: Negative for activity change, appetite change, fatigue, fever and unexpected weight change.  HENT: Negative for mouth sores, nosebleeds and trouble swallowing.   Eyes: Negative for redness and visual disturbance.  Respiratory: Negative for cough, shortness of breath and wheezing.   Cardiovascular: Negative for chest pain, palpitations and leg swelling.  Gastrointestinal: Negative for abdominal pain, nausea and vomiting.       Negative for changes in bowel habits.  Endocrine: Negative for cold intolerance, heat intolerance, polydipsia, polyphagia and polyuria.  Genitourinary: Negative for decreased urine volume, dysuria and hematuria.  Musculoskeletal: Negative for gait problem and myalgias.  Skin: Negative for rash.  Allergic/Immunologic: Positive for environmental allergies.  Neurological: Negative for syncope, weakness and headaches.      Current Outpatient Medications on File Prior to Visit  Medication Sig Dispense Refill  . ibuprofen (ADVIL) 200 MG tablet Take 200 mg by mouth every 6 (six) hours as needed.    . ciprofloxacin (CIPRO) 250 MG tablet Take 1 tablet (250 mg total) by mouth 2  (two) times daily. (Patient not taking: Reported on 03/31/2018) 6 tablet 0  . fluconazole (DIFLUCAN) 150 MG tablet Take 1 tablet (150 mg total) by mouth once. Repeat if needed (Patient not taking: Reported on 03/31/2018) 2 tablet 0  . Prenat w/o A Vit-FeFum-FePo-FA (CONCEPT OB) 130-92.4-1 MG CAPS Take 1 tablet by mouth daily. (Patient not taking: Reported on 03/31/2018) 30 capsule 12   No current facility-administered medications on file prior to visit.      Past Medical History:  Diagnosis Date  . Allergy   . Chicken pox   . Endometriosis   . Hx of oral aphthous ulcers   . Medical history non-contributory    No Known Allergies  Family History  Problem Relation Age of Onset  . Diabetes Mother   . Hyperlipidemia Mother   . Hypertension Mother   . Thyroid disease Mother   . Miscarriages / [place] Mother   . Prostatitis Father   . COPD Father   . Alcohol abuse Father   . Asthma Father   . Depression Sister   . Cancer Sister   . Depression Sister   . Cancer Sister   . Depression Sister   . Depression Sister   . Diabetes Sister     Social History   Socioeconomic History  . Marital status: Married    Spouse name: Not on file  . Number of children: Not on file  . Years of education: Not on file  . Highest education level: Not on file  Occupational History  . Not on file  Social Needs  . Financial resource strain: Not on file  . Food insecurity:  Worry: Not on file    Inability: Not on file  . Transportation needs:    Medical: Not on file    Non-medical: Not on file  Tobacco Use  . Smoking status: Never Smoker  . Smokeless tobacco: Never Used  Substance and Sexual Activity  . Alcohol use: No    Alcohol/week: 0.0 oz  . Drug use: No  . Sexual activity: Yes    Partners: Male  Lifestyle  . Physical activity:    Days per week: Not on file    Minutes per session: Not on file  . Stress: Not on file  Relationships  . Social connections:    Talks on phone:  Not on file    Gets together: Not on file    Attends religious service: Not on file    Active member of club or organization: Not on file    Attends meetings of clubs or organizations: Not on file    Relationship status: Not on file  Other Topics Concern  . Not on file  Social History Narrative  . Not on file    Vitals:   03/31/18 0901  BP: 116/70  Pulse: 91  Resp: 12  Temp: 98.4 F (36.9 C)  SpO2: 99%    Body mass index is 25.24 kg/m.   Physical Exam  Constitutional: She is oriented to person, place, and time. She appears well-developed and well-nourished. No distress.  HENT:  Head: Normocephalic and atraumatic.  Mouth/Throat: Oropharynx is clear and moist and mucous membranes are normal.  Eyes: Pupils are equal, round, and reactive to light. Conjunctivae are normal.  Neck: No tracheal deviation present. No thyroid mass and no thyromegaly present.  Cardiovascular: Normal rate and regular rhythm.  No murmur heard. Pulses:      Dorsalis pedis pulses are 2+ on the right side, and 2+ on the left side.  Respiratory: Effort normal and breath sounds normal. No respiratory distress.  GI: Soft. She exhibits no mass. There is no hepatomegaly. There is no tenderness.  Musculoskeletal: She exhibits no edema.  Lymphadenopathy:    She has no cervical adenopathy.  Neurological: She is alert and oriented to person, place, and time. She has normal strength. Coordination and gait normal.  Skin: Skin is warm. No rash noted. No erythema.  Psychiatric: She has a normal mood and affect.  Well groomed, good eye contact.      ASSESSMENT AND PLAN:   Ms. Dalya was seen today for establish care.  Diagnoses and all orders for this visit:  Lab Results  Component Value Date   TSH 1.43 03/31/2018   Lab Results  Component Value Date   CREATININE 0.91 03/31/2018   BUN 13 03/31/2018   NA 138 03/31/2018   K 4.2 03/31/2018   CL 104 03/31/2018   CO2 27 03/31/2018   Lab Results    Component Value Date   HGBA1C 5.3 03/31/2018    Endometriosis  Symptoms stable overall. She will continue following with gyn.  -     Ambulatory referral to Obstetrics / Gynecology  Infertility, female  Educated about possible complications of pregnancy at her age. She wants to have infertility work up and trying to get pregnant, so referral to gyn/ob placed.  Some lab work ordered today, further recommendations will be given according to labs results.  -     Ambulatory referral to Obstetrics / Gynecology -     TSH  Diabetes mellitus screening -     Basic metabolic panel -  Hemoglobin A1c     25 min face to face OV. > 50% was dedicated to discussion of Dx, prognosis, treatment options,and coordination of care.    Tylicia Sherman G. [place], MD  [facility]. [place] office.

## 2018-03-31 NOTE — Patient Instructions (Addendum)
A few things to remember from today's visit:   Infertility, female - Plan: Ambulatory referral to Obstetrics / Gynecology, TSH  Endometriosis - Plan: Ambulatory referral to Obstetrics / Gynecology  Diabetes mellitus screening - Plan: Basic metabolic panel, Hemoglobin A1c  Dieta saludable. Folic acid 1 mg daily.    Please be sure medication list is accurate. If a new problem present, please set up appointment sooner than planned today.

## 2018-04-03 ENCOUNTER — Telehealth: Payer: Self-pay | Admitting: Obstetrics and Gynecology

## 2018-04-03 NOTE — Telephone Encounter (Signed)
Attempted to call patient with a Spanish interpreter to schedule a new patient appointment for Infertility but could not leave a message to call back, recording stated "please enter mail box code."

## 2018-04-15 ENCOUNTER — Encounter: Payer: Self-pay | Admitting: Obstetrics and Gynecology

## 2018-04-15 ENCOUNTER — Other Ambulatory Visit: Payer: Self-pay

## 2018-04-15 ENCOUNTER — Ambulatory Visit: Payer: 59 | Admitting: Obstetrics and Gynecology

## 2018-04-15 ENCOUNTER — Other Ambulatory Visit: Payer: Self-pay | Admitting: Obstetrics and Gynecology

## 2018-04-15 VITALS — BP 100/60 | HR 66 | Resp 18 | Ht 67.25 in | Wt 158.4 lb

## 2018-04-15 DIAGNOSIS — Z01419 Encounter for gynecological examination (general) (routine) without abnormal findings: Secondary | ICD-10-CM

## 2018-04-15 DIAGNOSIS — N970 Female infertility associated with anovulation: Secondary | ICD-10-CM | POA: Diagnosis not present

## 2018-04-15 DIAGNOSIS — Z1231 Encounter for screening mammogram for malignant neoplasm of breast: Secondary | ICD-10-CM
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
## 2018-04-15 NOTE — Progress Notes (Signed)
Scheduled patient while in office [facility] at 7:40 am. Patient is agreeable to date and time.

## 2018-04-15 NOTE — Patient Instructions (Addendum)
You need to return on May 8 for blood work in the office.  You will also need to return for blood work in day number 3 of your next menstruation.  When you return for the blood work on day number 3 of your next menstruation, we will then schedule the hysterosalpingogram to test your fallopian tubes.    EXERCISE AND DIET:  We recommended that you start or continue a regular exercise program for good health. Regular exercise means any activity that makes your heart beat faster and makes you sweat.  We recommend exercising at least 30 minutes per day at least 3 days a week, preferably 4 or 5.  We also recommend a diet low in fat and sugar.  Inactivity, poor dietary choices and obesity can cause diabetes, heart attack, stroke, and kidney damage, among others.    ALCOHOL AND SMOKING:  Women should limit their alcohol intake to no more than 7 drinks/beers/glasses of wine (combined, not each!) per week. Moderation of alcohol intake to this level decreases your risk of breast cancer and liver damage. And of course, no recreational drugs are part of a healthy lifestyle.  And absolutely no smoking or even second hand smoke. Most people know smoking can cause heart and lung diseases, but did you know it also contributes to weakening of your bones? Aging of your skin?  Yellowing of your teeth and nails?  CALCIUM AND VITAMIN D:  Adequate intake of calcium and Vitamin D are recommended.  The recommendations for exact amounts of these supplements seem to change often, but generally speaking 600 mg of calcium (either carbonate or citrate) and 800 units of Vitamin D per day seems prudent. Certain women may benefit from higher intake of Vitamin D.  If you are among these women, your doctor will have told you during your visit.    PAP SMEARS:  Pap smears, to check for cervical cancer or precancers,  have traditionally been done yearly, although recent scientific advances have shown that most women can have pap smears less  often.  However, every woman still should have a physical exam from her gynecologist every year. It will include a breast check, inspection of the vulva and vagina to check for abnormal growths or skin changes, a visual exam of the cervix, and then an exam to evaluate the size and shape of the uterus and ovaries.  And after 40 years of age, a rectal exam is indicated to check for rectal cancers. We will also provide age appropriate advice regarding health maintenance, like when you should have certain vaccines, screening for sexually transmitted diseases, bone density testing, colonoscopy, mammograms, etc.   MAMMOGRAMS:  All women over 51 years old should have a yearly mammogram. Many facilities now offer a "3D" mammogram, which may cost around $50 extra out of pocket. If possible,  we recommend you accept the option to have the 3D mammogram performed.  It both reduces the number of women who will be called back for extra views which then turn out to be normal, and it is better than the routine mammogram at detecting truly abnormal areas.    COLONOSCOPY:  Colonoscopy to screen for colon cancer is recommended for all women at age 78.  We know, you hate the idea of the prep.  We agree, BUT, having colon cancer and not knowing it is worse!!  Colon cancer so often starts as a polyp that can be seen and removed at colonscopy, which can quite literally save your  life!  And if your first colonoscopy is normal and you have no family history of colon cancer, most women don't have to have it again for 10 years.  Once every ten years, you can do something that may end up saving your life, right?  We will be happy to help you get it scheduled when you are ready.  Be sure to check your insurance coverage so you understand how much it will cost.  It may be covered as a preventative service at no cost, but you should check your particular policy.

## 2018-04-16 ENCOUNTER — Other Ambulatory Visit (HOSPITAL_COMMUNITY)
Admission: RE | Admit: 2018-04-16 | Discharge: 2018-04-16 | Disposition: A | Discharge status: Home / Self Care | Payer: 59 | Source: Ambulatory Visit | Attending: Obstetrics and Gynecology | Admitting: Obstetrics and Gynecology

## 2018-04-16 DIAGNOSIS — Z01419 Encounter for gynecological examination (general) (routine) without abnormal findings: Secondary | ICD-10-CM | POA: Insufficient documentation

## 2018-04-16 NOTE — Addendum Note (Signed)
Addended by: Ardell Isaacs, Debbe Bales E on: 04/16/2018 08:36 AM   Modules accepted: Orders

## 2018-04-18 ENCOUNTER — Telehealth: Payer: Self-pay | Admitting: Obstetrics and Gynecology

## 2018-04-18 LAB — CYTOLOGY - PAP
DIAGNOSIS: NEGATIVE
HPV: NOT DETECTED

## 2018-04-23 ENCOUNTER — Other Ambulatory Visit: Payer: 59

## 2018-04-23 DIAGNOSIS — N970 Female infertility associated with anovulation: Secondary | ICD-10-CM

## 2018-04-24 LAB — PROGESTERONE: PROGESTERONE: 17.9 ng/mL

## 2018-04-25 ENCOUNTER — Telehealth: Payer: Self-pay

## 2018-04-25 NOTE — Telephone Encounter (Signed)
-----   Message from Patton Salles, MD sent at 04/24/2018  9:24 AM EDT ----- Please report progesterone level to patient.  It looks like she has ovulated this month!

## 2018-04-25 NOTE — Telephone Encounter (Signed)
Attempted to reach patient at number provided. There was no answer and there is no voicemail available.

## 2018-05-01 NOTE — Telephone Encounter (Signed)
Attempted to reach patient at number provided. There was no answer and there is no voicemail available.

## 2018-05-05 NOTE — Telephone Encounter (Signed)
I agree to have her return on day 3 of next menstrual period.

## 2018-05-05 NOTE — Telephone Encounter (Signed)
Spoke with patient. Results given. Patient verbalizes understanding. States she started her menses on 5/17 and is due to schedule more labs. Was due to have day 3 FSH, AMH, and rubella titer done. Advised she is past day 3 of menses and will need to contact the office with the first day of her next menses to schedule lab testing. Advised will review with Dr.Silva and return call with any additional information.

## 2018-05-06 ENCOUNTER — Ambulatory Visit
Admission: RE | Admit: 2018-05-06 | Discharge: 2018-05-06 | Disposition: A | Discharge status: Home / Self Care | Payer: 59 | Source: Ambulatory Visit | Attending: Obstetrics and Gynecology | Admitting: Obstetrics and Gynecology

## 2018-05-06 DIAGNOSIS — Z1231 Encounter for screening mammogram for malignant neoplasm of breast: Secondary | ICD-10-CM | POA: Diagnosis not present

## 2018-05-06 NOTE — Telephone Encounter (Signed)
Spoke with patient. Advised of message as seen below from Dr.Silva. Patient verbalizes understanding. Encounter closed. 

## 2018-05-29 ENCOUNTER — Telehealth: Payer: Self-pay | Admitting: Obstetrics and Gynecology

## 2018-05-29 DIAGNOSIS — N970 Female infertility associated with anovulation: Secondary | ICD-10-CM

## 2018-05-29 NOTE — Telephone Encounter (Signed)
Ok to proceed as you have ordered the labs.

## 2018-05-29 NOTE — Telephone Encounter (Signed)
Patient was told to call when she started her cycle to come in for labs .

## 2018-05-29 NOTE — Telephone Encounter (Signed)
Spoke with patient. LMP 05/29/18, requesting to schedule labs.   Per review of OV dated 04/15/18, patient to return for Day 3 FSH, AMH and Rubella Titer.   Advised patient labs will need to be collected on 05/31/18. Instructed patient on outside Lab. Provided [facility] [address], open Sat 8am -12pm.  Patient verbalizes understanding and is agreeable.   Dr. Edward JollySilva -please review pended future orders for lab collect, ok to proceed?

## 2018-05-29 NOTE — Telephone Encounter (Signed)
Future Lab orders placed and released, confirmed with [facility]. Encounter closed.

## 2018-05-31 DIAGNOSIS — N970 Female infertility associated with anovulation: Secondary | ICD-10-CM | POA: Diagnosis not present

## 2018-06-01 LAB — FOLLICLE STIMULATING HORMONE: FSH: 8.2 m[IU]/mL

## 2018-06-02 LAB — RUBELLA ANTIBODY, IGM: Rubella IgM: <20 AU/mL (ref 0.0–19.9)

## 2018-06-05 LAB — SPECIMEN STATUS REPORT

## 2018-06-05 LAB — ANTI MULLERIAN HORMONE: ANTI-MULLERIAN HORMONE (AMH): 1.06 ng/mL

## 2018-06-05 LAB — RUBELLA SCREEN: Rubella Antibodies, IGG: 5.09 index (ref >0.99)

## 2018-06-06 ENCOUNTER — Telehealth: Payer: Self-pay | Admitting: *Deleted

## 2018-06-06 NOTE — Telephone Encounter (Signed)
Notes recorded by Leda MinHamm, Deziya Amero N, RN on 06/06/2018 at 11:04 AM EDT Call placed using Spanish interpreter ID [phone]. No answer, unable to leave voicemail

## 2018-06-06 NOTE — Telephone Encounter (Signed)
-----   Message from Patton SallesBrook E Amundson C Silva, MD sent at 06/06/2018 12:33 AM EDT ----- Please contact patient with results of blood work.  You may need a Spanish interpretor to make this call.  Her AMH result and FSH testing indicate good ovarian function.  She is ovulating according to her prior progesterone level.   She is immune to [place], [place] measles.   Her next step in care is [facility]. She needs to call with the first day of her next menstrual period so we can schedule this.

## 2018-06-11 NOTE — Telephone Encounter (Signed)
Spoke with patient using pacific interpreter ID [id number]. Advised as seen below per Dr. Edward JollySilva. Patient aware [facility] first day of m[facility] to schedule HSG. Patient verbalizes understanding and is agreeable. Encounter closed.

## 2019-04-06 ENCOUNTER — Encounter: Payer: 59 | Admitting: Family Medicine

## 2021-07-11 ENCOUNTER — Ambulatory Visit (HOSPITAL_COMMUNITY)
Admission: EM | Admit: 2021-07-11 | Discharge: 2021-07-11 | Disposition: A | Discharge status: Home / Self Care | Payer: Self-pay | Attending: Internal Medicine | Admitting: Internal Medicine

## 2021-07-11 ENCOUNTER — Other Ambulatory Visit: Payer: Self-pay

## 2021-07-11 DIAGNOSIS — R300 Dysuria: Secondary | ICD-10-CM | POA: Insufficient documentation

## 2021-07-11 DIAGNOSIS — Z113 Encounter for screening for infections with a predominantly sexual mode of transmission: Secondary | ICD-10-CM

## 2021-07-11 DIAGNOSIS — N3001 Acute cystitis with hematuria: Secondary | ICD-10-CM

## 2021-07-11 DIAGNOSIS — R350 Frequency of micturition: Secondary | ICD-10-CM | POA: Insufficient documentation

## 2021-07-11 LAB — POCT URINALYSIS DIPSTICK, ED / UC
Bilirubin Urine: NEGATIVE
Glucose, UA: NEGATIVE mg/dL
Ketones, ur: NEGATIVE mg/dL
Nitrite: NEGATIVE
Protein, ur: NEGATIVE mg/dL
Specific Gravity, Urine: 1.01 (ref 1.005–1.030)
Urobilinogen, UA: 0.2 mg/dL (ref 0.0–1.0)
pH: 6.5 (ref 5.0–8.0)

## 2021-07-11 LAB — POC URINE PREG, ED: Preg Test, Ur: NEGATIVE

## 2021-07-11 MED ORDER — NITROFURANTOIN MONOHYD MACRO 100 MG PO CAPS: 100.0000 mg | ORAL_CAPSULE | Freq: Two times a day (BID) | ORAL | 0 refills | Status: AC
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
## 2021-07-11 NOTE — ED Triage Notes (Signed)
Pt reports dysuria ,strong odor to urine .

## 2021-07-11 NOTE — Discharge Instructions (Addendum)
You are being treated for urinary tract infection with Macrobid antibiotic.  Please take as prescribed.  Your urine culture and STD vaginal swab are pending.  We will call if these test results are positive.  Please increase clear oral fluid intake.  Please go to the hospital if any severe abdominal pain, fever, back pain develop.

## 2021-07-12 LAB — CERVICOVAGINAL ANCILLARY ONLY
Bacterial Vaginitis (gardnerella): NEGATIVE
Candida Glabrata: NEGATIVE
Candida Vaginitis: NEGATIVE
Chlamydia: NEGATIVE
Comment: NEGATIVE
Comment: NEGATIVE
Comment: NEGATIVE
Comment: NEGATIVE
Comment: NEGATIVE
Comment: NORMAL
Neisseria Gonorrhea: NEGATIVE
Trichomonas: NEGATIVE

## 2021-07-14 LAB — URINE CULTURE: Culture: >=100000 — AB

## 2022-07-07 ENCOUNTER — Ambulatory Visit
Admission: EM | Admit: 2022-07-07 | Discharge: 2022-07-07 | Disposition: A | Discharge status: Home / Self Care | Payer: BLUE CROSS/BLUE SHIELD | Attending: Urgent Care | Admitting: Urgent Care

## 2022-07-07 DIAGNOSIS — B3731 Acute candidiasis of vulva and vagina: Secondary | ICD-10-CM | POA: Insufficient documentation

## 2022-07-07 DIAGNOSIS — N898 Other specified noninflammatory disorders of vagina: Secondary | ICD-10-CM | POA: Insufficient documentation

## 2022-07-07 MED ORDER — FLUCONAZOLE 150 MG PO TABS: 150.0000 mg | ORAL_TABLET | ORAL | 0 refills | Status: DC

## 2022-07-07 NOTE — ED Triage Notes (Signed)
Last  LMP 06/21/22 does not use protection.   Vaginal Irritation, itching, burning X 3 day     No urinary symptoms.
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
## 2022-07-07 NOTE — ED Notes (Signed)
Last  LMP 06/21/22 does not use protection.   Vaginal Irritation, itching, burning X 3 day     No urinary symptoms.  

## 2022-07-08 LAB — URINE CULTURE: Culture: NO GROWTH

## 2022-07-09 LAB — CERVICOVAGINAL ANCILLARY ONLY
Bacterial Vaginitis (gardnerella): NEGATIVE
Candida Glabrata: NEGATIVE
Candida Vaginitis: POSITIVE — AB
Chlamydia: NEGATIVE
Comment: NEGATIVE
Comment: NEGATIVE
Comment: NEGATIVE
Comment: NEGATIVE
Comment: NEGATIVE
Comment: NORMAL
Neisseria Gonorrhea: NEGATIVE
Trichomonas: NEGATIVE

## 2022-07-20 ENCOUNTER — Encounter: Payer: Self-pay | Admitting: Nurse Practitioner

## 2022-07-20 ENCOUNTER — Ambulatory Visit (INDEPENDENT_AMBULATORY_CARE_PROVIDER_SITE_OTHER): Payer: BLUE CROSS/BLUE SHIELD | Admitting: Nurse Practitioner

## 2022-07-20 VITALS — BP 110/84 | HR 68 | Temp 97.4°F | Wt 164.4 lb

## 2022-07-20 DIAGNOSIS — Z13220 Encounter for screening for lipoid disorders: Secondary | ICD-10-CM | POA: Diagnosis not present

## 2022-07-20 DIAGNOSIS — Z0000 Encounter for general adult medical examination without abnormal findings: Secondary | ICD-10-CM | POA: Diagnosis not present

## 2022-07-20 LAB — CBC WITH DIFFERENTIAL/PLATELET
Basophils Absolute: 42 cells/uL (ref 0–200)
Basophils Relative: 0.8 %
Eosinophils Absolute: 31 cells/uL (ref 15–500)
Eosinophils Relative: 0.6 %
HCT: 37.3 % (ref 35.0–45.0)
Hemoglobin: 12.7 g/dL (ref 11.7–15.5)
Lymphs Abs: 1659 cells/uL (ref 850–3900)
MCH: 30 pg (ref 27.0–33.0)
MPV: 11.9 fL (ref 7.5–12.5)
Neutro Abs: 3125 cells/uL (ref 1500–7800)
Platelets: 279 10*3/uL (ref 140–400)
RBC: 4.24 10*6/uL (ref 3.80–5.10)
RDW: 12.7 % (ref 11.0–15.0)
Total Lymphocyte: 31.9 %
WBC: 5.2 10*3/uL (ref 3.8–10.8)
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
## 2022-07-20 NOTE — Patient Instructions (Signed)
It was great to see you!  We are checking your labs today and will let you know the results via mychart/phone.   Let's follow-up in 1 year, sooner if you have concerns.  If a referral was placed today, you will be contacted for an appointment. Please note that routine referrals can sometimes take up to 3-4 weeks to process. Please call our office if you haven't heard anything after this time frame.  Take care,  Jaydence Arnesen, NP  

## 2022-07-21 LAB — COMPREHENSIVE METABOLIC PANEL
AG Ratio: 1.5 (calc) (ref 1.0–2.5)
ALT: 8 U/L (ref 6–29)
AST: 14 U/L (ref 10–30)
Albumin: 4.2 g/dL (ref 3.6–5.1)
Alkaline phosphatase (APISO): 40 U/L (ref 31–125)
BUN: 17 mg/dL (ref 7–25)
CO2: 26 mmol/L (ref 20–32)
Calcium: 9.1 mg/dL (ref 8.6–10.2)
Chloride: 105 mmol/L (ref 98–110)
Creat: 0.99 mg/dL (ref 0.50–0.99)
Globulin: 2.8 g/dL (calc) (ref 1.9–3.7)
Glucose, Bld: 84 mg/dL (ref 65–99)
Potassium: 4.3 mmol/L (ref 3.5–5.3)
Sodium: 140 mmol/L (ref 135–146)
Total Bilirubin: 0.4 mg/dL (ref 0.2–1.2)
Total Protein: 7 g/dL (ref 6.1–8.1)

## 2022-07-21 LAB — LIPID PANEL
Cholesterol: 204 mg/dL — ABNORMAL HIGH (ref <200)
HDL: 52 mg/dL (ref >=50)
LDL Cholesterol (Calc): 120 mg/dL (calc) — ABNORMAL HIGH
Non-HDL Cholesterol (Calc): 152 mg/dL (calc) — ABNORMAL HIGH (ref <130)
Total CHOL/HDL Ratio: 3.9 (calc) (ref <5.0)
Triglycerides: 201 mg/dL — ABNORMAL HIGH (ref <150)

## 2022-07-21 LAB — CBC WITH DIFFERENTIAL/PLATELET
Absolute Monocytes: 343 cells/uL (ref 200–950)
MCHC: 34 g/dL (ref 32.0–36.0)
MCV: 88 fL (ref 80.0–100.0)
Monocytes Relative: 6.6 %
Neutrophils Relative %: 60.1 %

## 2022-07-24 NOTE — Progress Notes (Signed)
Called and informed patient of results and provider instructions. Patient voiced understanding. Sw, cma

## 2022-09-06 NOTE — Progress Notes (Signed)
Acute Office Visit  Subjective:     Patient ID: April Andersen, female    DOB: Dec 26, 1975, 46 y.o.   MRN: [id number]  Chief Complaint  Patient presents with   Acute Visit    Mensuration concerns.08/16/22 was a normal day for period to start. 08/31/22 period returned for three days. Heavy more  than normal.Pain in breast  a week later after her normal period date. Pt stated  period returned 9/15. Didn't take nothing for pain. Feel tired than normal.    HPI Patient is in today for abnormal uterine bleeding.  She states that she had her normal period on August 30 which lasted 3 days like normal.  She then had noticed a few weeks later that her breasts were tender and then she got her period again on 08/31/2022.  She had this for another 3 days with heavy bleeding.  She states that she was having some cramping, however not as bad as normal.  She is concerned because her sisters have history of uterine cancer.  She also has noticed some soreness in her neck and wants to make sure that her thyroid is normal.  She is not taking anything for birth control, she states that pregnancy is a possibility.  She denies vaginal discharge, urinary frequency, chest pain.  ROS See pertinent positives and negatives per HPI.     Objective:    BP 124/70   Pulse 75   Temp 97.9 F (36.6 C) (Temporal)   Wt 163 lb (73.9 kg)   SpO2 98%   BMI 25.34 kg/m    Physical Exam Vitals and nursing note reviewed. Exam conducted with a chaperone present.  Constitutional:      General: She is not in acute distress.    Appearance: Normal appearance.  HENT:     Head: Normocephalic.  Eyes:     Conjunctiva/sclera: Conjunctivae normal.  Cardiovascular:     Rate and Rhythm: Normal rate and regular rhythm.     Pulses: Normal pulses.     Heart sounds: Normal heart sounds.  Pulmonary:     Effort: Pulmonary effort is normal.     Breath sounds: Normal breath sounds.  Genitourinary:    General: Normal vulva.      Exam position: Lithotomy position.     Labia:        Right: No rash, tenderness or lesion.        Left: No rash, tenderness or lesion.      Vagina: Normal.     Cervix: Normal.     Uterus: Normal.      Adnexa: Right adnexa normal and left adnexa normal.  Musculoskeletal:     Cervical back: Normal range of motion.  Skin:    General: Skin is warm.  Neurological:     General: No focal deficit present.     Mental Status: She is alert and oriented to person, place, and time.  Psychiatric:        Mood and Affect: Mood normal.        Behavior: Behavior normal.        Thought Content: Thought content normal.        Judgment: Judgment normal.     Results for orders placed or performed in visit on 09/07/22  POCT urine pregnancy  Result Value Ref Range   Preg Test, Ur Negative Negative        Assessment & Plan:   Problem List Items Addressed This Visit  Genitourinary   Abnormal uterine bleeding - Primary    She states that she had a normal period on August 30, then she had another period on September 15.  Both lasted 3 days and were heavy with bleeding.  She has been experiencing some fatigue.  With her family history of uterine cancer, she is concerned for something abnormal other than perimenopausal.  We will check BMP, CBC, TSH, LH, FSH, pelvic ultrasound, and Pap done today.  Discussed that if labs are normal it is most likely being perimenopausal which she is around the age to start going through this.  Follow-up if symptoms worsen, any further concerns, or if testing abnormal.      Relevant Orders   CBC   Basic metabolic panel   TSH   FSH   LH   US Pelvic Complete With Transvaginal   POCT urine pregnancy (Completed)   Other Visit Diagnoses     Fatigue, unspecified type       Fatigue with extra menstrual period this month. Will check TSH, FSH, LH, BMP, and CBC.    Relevant Orders   CBC   Basic metabolic panel   TSH   Encounter for screening mammogram for  malignant neoplasm of breast       Mammogram ordered today   Relevant Orders   MM 3D SCREEN BREAST BILATERAL   Screening for cervical cancer       Pap with HPV done today   Relevant Orders   Cytology - PAP       No orders of the defined types were placed in this encounter.   Return if symptoms worsen or fail to improve.  Gerre Scull, NP

## 2022-09-07 ENCOUNTER — Other Ambulatory Visit (HOSPITAL_COMMUNITY): Admit: 2022-09-07 | Payer: BLUE CROSS/BLUE SHIELD

## 2022-09-07 ENCOUNTER — Encounter: Payer: Self-pay | Admitting: Nurse Practitioner

## 2022-09-07 ENCOUNTER — Ambulatory Visit (INDEPENDENT_AMBULATORY_CARE_PROVIDER_SITE_OTHER): Payer: BLUE CROSS/BLUE SHIELD | Admitting: Nurse Practitioner

## 2022-09-07 VITALS — BP 124/70 | HR 75 | Temp 97.9°F | Wt 163.0 lb

## 2022-09-07 DIAGNOSIS — Z124 Encounter for screening for malignant neoplasm of cervix: Secondary | ICD-10-CM

## 2022-09-07 DIAGNOSIS — Z1231 Encounter for screening mammogram for malignant neoplasm of breast: Secondary | ICD-10-CM

## 2022-09-07 DIAGNOSIS — N939 Abnormal uterine and vaginal bleeding, unspecified: Secondary | ICD-10-CM | POA: Diagnosis not present

## 2022-09-07 DIAGNOSIS — R5383 Other fatigue: Secondary | ICD-10-CM

## 2022-09-07 LAB — POCT URINE PREGNANCY: Preg Test, Ur: NEGATIVE

## 2022-09-07 NOTE — Patient Instructions (Signed)
It was great to see you!  We are checking your labs today and will let you know the results via mychart/phone.    I have ordered an ultrasound of your pelvis. They will call to schedule.   I have placed an order for screening mammogram, they should call you to schedule. If you do not hear from them in the next week, please call:  [place], [place], [place]  Let's follow-up if your symptoms worsen or don't improve.   Take care,  Vance Peper, NP

## 2022-09-07 NOTE — Assessment & Plan Note (Signed)
She states that she had a normal period on August 30, then she had another period on September 15.  Both lasted 3 days and were heavy with bleeding.  She has been experiencing some fatigue.  With her family history of uterine cancer, she is concerned for something abnormal other than perimenopausal.  We will check BMP, CBC, TSH, LH, FSH, pelvic ultrasound, and Pap done today.  Discussed that if labs are normal it is most likely being perimenopausal which she is around the age to start going through this.  Follow-up if symptoms worsen, any further concerns, or if testing abnormal.

## 2022-09-08 LAB — BASIC METABOLIC PANEL
BUN/Creatinine Ratio: 22 (ref 9–23)
BUN: 21 mg/dL (ref 6–24)
CO2: 21 mmol/L (ref 20–29)
Calcium: 9.4 mg/dL (ref 8.7–10.2)
Chloride: 102 mmol/L (ref 96–106)
Creatinine, Ser: 0.95 mg/dL (ref 0.57–1.00)
Glucose: 89 mg/dL (ref 70–99)
Potassium: 4 mmol/L (ref 3.5–5.2)
Sodium: 141 mmol/L (ref 134–144)
eGFR: 76 mL/min/{1.73_m2} (ref >59)

## 2022-09-08 LAB — CBC
Hematocrit: 38.5 % (ref 34.0–46.6)
Hemoglobin: 12.7 g/dL (ref 11.1–15.9)
MCH: 29.4 pg (ref 26.6–33.0)
MCHC: 33 g/dL (ref 31.5–35.7)
MCV: 89 fL (ref 79–97)
Platelets: 308 10*3/uL (ref 150–450)
RBC: 4.32 x10E6/uL (ref 3.77–5.28)
RDW: 12.4 % (ref 11.7–15.4)
WBC: 5.5 10*3/uL (ref 3.4–10.8)

## 2022-09-08 LAB — TSH: TSH: 1.57 u[IU]/mL (ref 0.450–4.500)

## 2022-09-08 LAB — LUTEINIZING HORMONE: LH: 9.4 m[IU]/mL

## 2022-09-08 LAB — FOLLICLE STIMULATING HORMONE: FSH: 19.2 m[IU]/mL

## 2022-09-11 LAB — CYTOLOGY - PAP
Comment: NEGATIVE
Diagnosis: UNDETERMINED — AB
High risk HPV: NEGATIVE

## 2022-09-11 NOTE — Progress Notes (Signed)
Called and informed patient of results and provider instructions. Patient voiced understanding. Pt wanted to make pcp aware of family hx of cervical cancer. Sw, cma

## 2023-05-09 ENCOUNTER — Ambulatory Visit: Payer: BLUE CROSS/BLUE SHIELD | Admitting: Nurse Practitioner

## 2023-07-22 ENCOUNTER — Encounter: Payer: BLUE CROSS/BLUE SHIELD | Admitting: Nurse Practitioner
